# Patient Record
Sex: Female | Born: 1995 | Race: Black or African American | Hispanic: No | Marital: Single | Smoking: Former smoker
Health system: Southern US, Community
[De-identification: ages and names within clinical notes are randomized; demographics above are authoritative.]

## PROBLEM LIST (undated history)

## (undated) ENCOUNTER — Inpatient Hospital Stay (HOSPITAL_COMMUNITY): Payer: Self-pay

## (undated) DIAGNOSIS — N739 Female pelvic inflammatory disease, unspecified: Secondary | ICD-10-CM

## (undated) DIAGNOSIS — G629 Polyneuropathy, unspecified: Secondary | ICD-10-CM

## (undated) DIAGNOSIS — O139 Gestational [pregnancy-induced] hypertension without significant proteinuria, unspecified trimester: Secondary | ICD-10-CM

## (undated) DIAGNOSIS — D219 Benign neoplasm of connective and other soft tissue, unspecified: Secondary | ICD-10-CM

## (undated) HISTORY — PX: FINGER TENDON REPAIR: SHX1640

## (undated) HISTORY — PX: COSMETIC SURGERY: SHX468

---
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
## 2013-02-16 LAB — PROCEDURE REPORT - SCANNED: Pap: NEGATIVE

## 2013-12-09 ENCOUNTER — Encounter (HOSPITAL_COMMUNITY): Payer: Self-pay | Admitting: Emergency Medicine

## 2013-12-09 ENCOUNTER — Emergency Department (HOSPITAL_COMMUNITY)
Admission: EM | Admit: 2013-12-09 | Discharge: 2013-12-09 | Disposition: A | Discharge status: Home / Self Care | Payer: BC Managed Care – PPO | Attending: Emergency Medicine | Admitting: Emergency Medicine

## 2013-12-09 DIAGNOSIS — F17200 Nicotine dependence, unspecified, uncomplicated: Secondary | ICD-10-CM | POA: Insufficient documentation

## 2013-12-09 DIAGNOSIS — T7421XA Adult sexual abuse, confirmed, initial encounter: Secondary | ICD-10-CM | POA: Insufficient documentation

## 2013-12-09 DIAGNOSIS — S199XXA Unspecified injury of neck, initial encounter: Secondary | ICD-10-CM

## 2013-12-09 DIAGNOSIS — S0181XA Laceration without foreign body of other part of head, initial encounter: Secondary | ICD-10-CM

## 2013-12-09 DIAGNOSIS — S0993XA Unspecified injury of face, initial encounter: Secondary | ICD-10-CM | POA: Diagnosis present

## 2013-12-09 DIAGNOSIS — S0180XA Unspecified open wound of other part of head, initial encounter: Secondary | ICD-10-CM | POA: Insufficient documentation

## 2013-12-09 MED ORDER — HYDROCODONE-ACETAMINOPHEN 5-325 MG PO TABS: ORAL_TABLET | ORAL | Status: DC

## 2013-12-09 MED ORDER — NAPROXEN 500 MG PO TABS: 500.0000 mg | ORAL_TABLET | Freq: Two times a day (BID) | ORAL | Status: DC

## 2013-12-09 MED ORDER — HYDROCODONE-ACETAMINOPHEN 5-325 MG PO TABS
1.0000 | ORAL_TABLET | Freq: Once | ORAL | Status: AC
  Administered 2013-12-09: 1 via ORAL
  Filled 2013-12-09: qty 1

## 2013-12-09 MED ORDER — LIDOCAINE-EPINEPHRINE 2 %-1:100000 IJ SOLN: 20.0000 mL | Freq: Once | INTRAMUSCULAR | Status: DC
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
## 2013-12-09 NOTE — ED Notes (Signed)
Bed: WTR6 Expected date:  Expected time:  Means of arrival:  Comments: 

## 2013-12-09 NOTE — ED Notes (Signed)
Suture cart and lidocaine at bedside.  

## 2013-12-09 NOTE — ED Provider Notes (Signed)
Medical screening examination/treatment/procedure(s) were performed by non-physician practitioner and as supervising physician I was immediately available for consultation/collaboration.   EKG Interpretation None       Kalman Drape, MD 12/09/13 [phone]

## 2013-12-09 NOTE — Discharge Instructions (Signed)
Please read and follow all provided instructions.  Your diagnoses today include:  1. Forehead laceration, initial encounter     Tests performed today include:  Vital signs. See below for your results today.   Medications prescribed:   Vicodin (hydrocodone/acetaminophen) - narcotic pain medication  DO NOT drive or perform any activities that require you to be awake and alert because this medicine can make you drowsy. BE VERY CAREFUL not to take multiple medicines containing Tylenol (also called acetaminophen). Doing so can lead to an overdose which can damage your liver and cause liver failure and possibly death.   Naproxen - anti-inflammatory pain medication  Do not exceed 500mg  naproxen every 12 hours, take with food  You have been prescribed an anti-inflammatory medication or NSAID. Take with food. Take smallest effective dose for the shortest duration needed for your pain. Stop taking if you experience stomach pain or vomiting.   Take any prescribed medications only as directed.   Home care instructions:  Follow any educational materials and wound care instructions contained in this packet.   Keep affected area above the level of your heart when possible to minimize swelling. Wash area gently twice a day with warm soapy water. Do not apply alcohol or hydrogen peroxide. Cover the area if it draining or weeping.   Follow-up instructions: Suture Removal: Return to the Emergency Department or see your primary care care doctor in 3-4 days for a recheck of your wound and removal of your sutures or staples.    Return instructions:  Return to the Emergency Department if you have:  Fever  Worsening pain  Worsening swelling of the wound  Pus draining from the wound  Redness of the skin that moves away from the wound, especially if it streaks away from the affected area   Any other emergent concerns  Your vital signs today were: BP 117/77   Pulse 80   Temp(Src) 97.4 F (36.3  C) (Oral)   Resp 18   Ht 5\' 2"  (1.575 m)   Wt 186 lb (84.369 kg)   BMI 34.01 kg/m2   SpO2 99%   LMP 11/26/2013 If your blood pressure (BP) was elevated above 135/85 this visit, please have this repeated by your doctor within one month. --------------

## 2013-12-14 ENCOUNTER — Encounter (HOSPITAL_COMMUNITY): Payer: Self-pay | Admitting: Emergency Medicine

## 2013-12-14 ENCOUNTER — Emergency Department (HOSPITAL_COMMUNITY)
Admission: EM | Admit: 2013-12-14 | Discharge: 2013-12-14 | Disposition: A | Discharge status: Home / Self Care | Payer: BC Managed Care – PPO | Attending: Emergency Medicine | Admitting: Emergency Medicine

## 2013-12-14 DIAGNOSIS — Z4802 Encounter for removal of sutures: Secondary | ICD-10-CM | POA: Diagnosis present

## 2013-12-14 DIAGNOSIS — F17200 Nicotine dependence, unspecified, uncomplicated: Secondary | ICD-10-CM | POA: Insufficient documentation

## 2013-12-14 NOTE — ED Notes (Signed)
Pt to be assessed for suture removal from forehead

## 2013-12-14 NOTE — Discharge Instructions (Signed)
May apply neosporin for now until scab comes off.  Once this happens, may start applying mederma to help reduce scarring. Return to the ED for new concerns.

## 2013-12-14 NOTE — ED Provider Notes (Signed)
CSN: [id number]     Arrival date & time 12/14/13  1518 History  This chart was scribed for non-physician practitioner, Quincy Carnes, PA-C working with Babette Relic, MD by Frederich Balding, ED scribe. This patient was seen in room WTR8/WTR8 and the patient's care was started at 3:30 PM.    Chief Complaint  Patient presents with  . Suture / Staple Removal   The history is provided by the patient. No language interpreter was used.   HPI Comments: Kimberly Ponce is a 18 y.o. female who presents to the Emergency Department for suture removal. Pt had 5 sutures placed to her forehead 5 days ago. Denies any pain or drainage from the area. No fever, chills, sweats.  Pt's tetanus is up to date.   No past medical history on file. No past surgical history on file. No family history on file. History  Substance Use Topics  . Smoking status: Current Some Day Smoker  . Smokeless tobacco: Not on file  . Alcohol Use: Yes     Comment: occ   OB History   Grav Para Term Preterm Abortions TAB SAB Ect Mult Living                 Review of Systems  Skin: Positive for wound.  All other systems reviewed and are negative.  Allergies  Other  Home Medications   Prior to Admission medications   Medication Sig Start Date End Date Taking? Authorizing Provider  ibuprofen (ADVIL,MOTRIN) 200 MG tablet Take 400 mg by mouth every 6 (six) hours as needed for headache or cramping.   Yes Historical Provider, MD   LMP 11/26/2013  Physical Exam  Nursing note and vitals reviewed. Constitutional: She is oriented to person, place, and time. She appears well-developed and well-nourished.  HENT:  Head: Normocephalic and atraumatic.  Mouth/Throat: Oropharynx is clear and moist.  Eyes: Conjunctivae and EOM are normal. Pupils are equal, round, and reactive to light.  Neck: Normal range of motion.  Cardiovascular: Normal rate, regular rhythm and normal heart sounds.   Pulmonary/Chest: Effort normal and breath  sounds normal.  Abdominal: Soft. Bowel sounds are normal.  Musculoskeletal: Normal range of motion.  Neurological: She is alert and oriented to person, place, and time.  Skin: Skin is warm and dry.  5 Ethilon sutures to mid forehead. No drainage or sings of infection.  Psychiatric: She has a normal mood and affect.    ED Course  Procedures (including critical care time)  SUTURE REMOVAL Performed by: Quincy Carnes, PA-C Authorized by: Quincy Carnes, PA-C Consent: Verbal consent obtained. Consent given by: patient Required items: required blood products, implants, devices, and special equipment available  Time out: Immediately prior to procedure a "time out" was called to verify the correct patient, procedure, equipment, support staff and site/side marked as required. Location: forehead Wound Appearance: clean, well healing  Sutures Removed: 5 Patient tolerance: Patient tolerated the procedure well with no immediate complications.   COORDINATION OF CARE: 3:32 PM-Discussed treatment plan which includes suture removal with pt at bedside and pt agreed to plan.   Labs Review Labs Reviewed - No data to display  Imaging Review No results found.   EKG Interpretation None      MDM   Final diagnoses:  Visit for suture removal   Tetanus UTD.  Wound healing well without signs of infection. Sutures removed without difficulty.  Encouraged neosporin for now, mederma to help reduce scarring once fully healed. FU with PCP as  needed.  Discussed plan with patient, he/she acknowledged understanding and agreed with plan of care.  Return precautions given for new or worsening symptoms.  I personally performed the services described in this documentation, which was scribed in my presence. The recorded information has been reviewed and is accurate.  Larene Pickett, PA-C 12/14/13 1551

## 2013-12-27 NOTE — ED Provider Notes (Signed)
Medical screening examination/treatment/procedure(s) were performed by non-physician practitioner and as supervising physician I was immediately available for consultation/collaboration.   EKG Interpretation None       Babette Relic, MD 12/27/13 1311

## 2014-03-23 ENCOUNTER — Encounter (HOSPITAL_COMMUNITY): Payer: Self-pay | Admitting: Oncology

## 2014-03-23 ENCOUNTER — Emergency Department (HOSPITAL_COMMUNITY)
Admission: EM | Admit: 2014-03-23 | Discharge: 2014-03-24 | Disposition: A | Discharge status: Home / Self Care | Payer: BC Managed Care – PPO | Attending: Emergency Medicine | Admitting: Emergency Medicine

## 2014-03-23 DIAGNOSIS — R109 Unspecified abdominal pain: Secondary | ICD-10-CM | POA: Diagnosis present

## 2014-03-23 DIAGNOSIS — Z720 Tobacco use: Secondary | ICD-10-CM | POA: Diagnosis not present

## 2014-03-23 DIAGNOSIS — Z3202 Encounter for pregnancy test, result negative: Secondary | ICD-10-CM | POA: Insufficient documentation

## 2014-03-23 DIAGNOSIS — N739 Female pelvic inflammatory disease, unspecified: Secondary | ICD-10-CM | POA: Diagnosis not present

## 2014-03-23 DIAGNOSIS — N730 Acute parametritis and pelvic cellulitis: Secondary | ICD-10-CM

## 2014-03-23 LAB — CBC WITH DIFFERENTIAL/PLATELET
BASOS PCT: 0 % (ref 0–1)
Basophils Absolute: 0 10*3/uL (ref 0.0–0.1)
Eosinophils Absolute: 0 10*3/uL (ref 0.0–0.7)
Eosinophils Relative: 0 % (ref 0–5)
HCT: 36.9 % (ref 36.0–46.0)
HEMOGLOBIN: 12 g/dL (ref 12.0–15.0)
LYMPHS ABS: 2 10*3/uL (ref 0.7–4.0)
Lymphocytes Relative: 18 % (ref 12–46)
MCH: 30.2 pg (ref 26.0–34.0)
MCHC: 32.5 g/dL (ref 30.0–36.0)
MCV: 92.7 fL (ref 78.0–100.0)
MONOS PCT: 6 % (ref 3–12)
Monocytes Absolute: 0.7 10*3/uL (ref 0.1–1.0)
NEUTROS ABS: 8.1 10*3/uL — AB (ref 1.7–7.7)
NEUTROS PCT: 76 % (ref 43–77)
PLATELETS: 288 10*3/uL (ref 150–400)
RBC: 3.98 MIL/uL (ref 3.87–5.11)
RDW: 12.7 % (ref 11.5–15.5)
WBC: 10.8 10*3/uL — ABNORMAL HIGH (ref 4.0–10.5)

## 2014-03-23 LAB — URINALYSIS, ROUTINE W REFLEX MICROSCOPIC
Bilirubin Urine: NEGATIVE
GLUCOSE, UA: NEGATIVE mg/dL
HGB URINE DIPSTICK: NEGATIVE
Ketones, ur: NEGATIVE mg/dL
Nitrite: NEGATIVE
PH: 6.5 (ref 5.0–8.0)
Protein, ur: NEGATIVE mg/dL
SPECIFIC GRAVITY, URINE: 1.024 (ref 1.005–1.030)
Urobilinogen, UA: 1 mg/dL (ref 0.0–1.0)

## 2014-03-23 LAB — POC URINE PREG, ED: Preg Test, Ur: NEGATIVE

## 2014-03-23 LAB — URINE MICROSCOPIC-ADD ON

## 2014-03-23 NOTE — ED Notes (Signed)
Pt c/o of intermittent abdominal pain x 3 days.  Pt reports that pain became constant today w/ radiation to her back and her legs.  Pt rates the pain 7/10, crampy in nature.

## 2014-03-24 LAB — WET PREP, GENITAL
Clue Cells Wet Prep HPF POC: NONE SEEN
Trich, Wet Prep: NONE SEEN
Yeast Wet Prep HPF POC: NONE SEEN

## 2014-03-24 LAB — COMPREHENSIVE METABOLIC PANEL
ALBUMIN: 4.4 g/dL (ref 3.5–5.2)
ALK PHOS: 48 U/L (ref 39–117)
ALT: 12 U/L (ref 0–35)
ANION GAP: 6 (ref 5–15)
AST: 16 U/L (ref 0–37)
BILIRUBIN TOTAL: 0.2 mg/dL — AB (ref 0.3–1.2)
BUN: 11 mg/dL (ref 6–23)
CHLORIDE: 104 meq/L (ref 96–112)
CO2: 28 mmol/L (ref 19–32)
Calcium: 9.8 mg/dL (ref 8.4–10.5)
Creatinine, Ser: 0.87 mg/dL (ref 0.50–1.10)
GFR calc Af Amer: >90 mL/min (ref >90)
GFR calc non Af Amer: >90 mL/min (ref >90)
Glucose, Bld: 98 mg/dL (ref 70–99)
POTASSIUM: 3.5 mmol/L (ref 3.5–5.1)
SODIUM: 138 mmol/L (ref 135–145)
Total Protein: 8.3 g/dL (ref 6.0–8.3)

## 2014-03-24 LAB — LIPASE, BLOOD: Lipase: 17 U/L (ref 11–59)

## 2014-03-24 LAB — HIV ANTIBODY (ROUTINE TESTING W REFLEX): HIV 1&2 Ab, 4th Generation: NONREACTIVE

## 2014-03-24 LAB — RPR

## 2014-03-24 MED ORDER — HYDROCODONE-ACETAMINOPHEN 5-325 MG PO TABS
1.0000 | ORAL_TABLET | Freq: Once | ORAL | Status: AC
  Administered 2014-03-24: 1 via ORAL
  Filled 2014-03-24: qty 1

## 2014-03-24 MED ORDER — DOXYCYCLINE HYCLATE 100 MG PO CAPS: 100.0000 mg | ORAL_CAPSULE | Freq: Two times a day (BID) | ORAL | Status: DC

## 2014-03-24 MED ORDER — LIDOCAINE HCL 1 % IJ SOLN
INTRAMUSCULAR | Status: AC
  Administered 2014-03-24: 0.9 mL
  Filled 2014-03-24: qty 20

## 2014-03-24 MED ORDER — CEFTRIAXONE SODIUM 250 MG IJ SOLR
250.0000 mg | Freq: Once | INTRAMUSCULAR | Status: AC
  Administered 2014-03-24: 250 mg via INTRAMUSCULAR
  Filled 2014-03-24: qty 250

## 2014-03-24 MED ORDER — HYDROCODONE-ACETAMINOPHEN 5-325 MG PO TABS: 1.0000 | ORAL_TABLET | Freq: Four times a day (QID) | ORAL | Status: DC | PRN

## 2014-03-24 NOTE — ED Provider Notes (Signed)
CSN: [id number]     Arrival date & time 03/23/14  2248 History   First MD Initiated Contact with Patient 03/23/14 2328     Chief Complaint  Patient presents with  . Abdominal Pain   HPI  Patient is a 18 year old female who presents emergency room for evaluation of suprapubic abdominal pain. Patient states that originally her pain was intermittent and crampy in nature. Pain became constant today and radiates to her lower back and bilateral legs. Patient states she's been having urinary frequency and urgency. She has had no dysuria. She has had no vaginal discharge. She does think that she noticed some blood in her urine today. Patient recently had her last period on December 16 and said that it was slightly heavier than normal. Patient is not currently on any contraceptives. Patient is sexually active and is not using condoms regularly. Patient has had no new partners. Patient does not report any past history of STDs.  History reviewed. No pertinent past medical history. History reviewed. No pertinent past surgical history. Family History  Problem Relation Age of Onset  . Cancer Father   . Hypertension Father   . Cancer Other   . Diabetes Other    History  Substance Use Topics  . Smoking status: Current Some Day Smoker  . Smokeless tobacco: Not on file  . Alcohol Use: Yes     Comment: occ   OB History    No data available     Review of Systems  Constitutional: Negative for fever, chills and fatigue.  Gastrointestinal: Positive for nausea and abdominal pain. Negative for vomiting, diarrhea, constipation and blood in stool.  Genitourinary: Positive for urgency, frequency and hematuria. Negative for dysuria, flank pain, decreased urine volume, vaginal bleeding, vaginal discharge, difficulty urinating, vaginal pain and dyspareunia.  Musculoskeletal: Positive for back pain.  All other systems reviewed and are negative.     Allergies  Other  Home Medications   Prior to  Admission medications   Medication Sig Start Date End Date Taking? Authorizing Provider  aspirin-acetaminophen-caffeine (PAMPRIN MAX) [phone] MG per tablet Take 1 tablet by mouth every 6 (six) hours as needed for headache.   Yes Historical Provider, MD  ibuprofen (ADVIL,MOTRIN) 200 MG tablet Take 400 mg by mouth every 6 (six) hours as needed for headache or cramping.   Yes Historical Provider, MD  doxycycline (VIBRAMYCIN) 100 MG capsule Take 1 capsule (100 mg total) by mouth 2 (two) times daily. One po bid x 7 days 03/24/14   Jonathen Rathman A Forcucci, PA-C  HYDROcodone-acetaminophen (NORCO/VICODIN) 5-325 MG per tablet Take 1 tablet by mouth every 6 (six) hours as needed for moderate pain or severe pain. 03/24/14   Ronne Savoia A Forcucci, PA-C   BP 125/68 mmHg  Pulse 70  Temp(Src) 98.5 F (36.9 C) (Oral)  Resp 20  SpO2 100%  LMP 03/15/2014 Physical Exam  Constitutional: She is oriented to person, place, and time. She appears well-developed and well-nourished. No distress.  HENT:  Head: Normocephalic and atraumatic.  Mouth/Throat: Oropharynx is clear and moist. No oropharyngeal exudate.  Eyes: Conjunctivae and EOM are normal. Pupils are equal, round, and reactive to light. No scleral icterus.  Neck: Normal range of motion. Neck supple. No JVD present. No thyromegaly present.  Cardiovascular: Normal rate, regular rhythm, normal heart sounds and intact distal pulses.  Exam reveals no gallop and no friction rub.   No murmur heard. Pulmonary/Chest: Effort normal and breath sounds normal. No respiratory distress. She has no wheezes. She  has no rales. She exhibits no tenderness.  Abdominal: Soft. Normal appearance and bowel sounds are normal. She exhibits no distension and no mass. There is tenderness in the right lower quadrant, suprapubic area and left lower quadrant. There is no rigidity, no rebound, no guarding, no tenderness at McBurney's point and negative Murphy's sign.  Genitourinary: No labial  fusion. There is no rash, tenderness, lesion or injury on the right labia. There is no rash, tenderness, lesion or injury on the left labia. Uterus is tender. Uterus is not deviated, not enlarged and not fixed. Cervix exhibits motion tenderness (Minimal) and friability. Cervix exhibits no discharge. Right adnexum displays no mass, no tenderness and no fullness. Left adnexum displays no mass, no tenderness and no fullness. No erythema, tenderness or bleeding in the vagina. No foreign body around the vagina. No signs of injury around the vagina. Vaginal discharge found.  Thin yellow discharge in the vaginal vault  Musculoskeletal: Normal range of motion.  Lymphadenopathy:    She has no cervical adenopathy.  Neurological: She is alert and oriented to person, place, and time.  Skin: Skin is warm and dry. She is not diaphoretic.  Psychiatric: She has a normal mood and affect. Her behavior is normal. Judgment and thought content normal.  Nursing note and vitals reviewed.   ED Course  Procedures (including critical care time) Labs Review Labs Reviewed  WET PREP, GENITAL - Abnormal; Notable for the following:    WBC, Wet Prep HPF POC TOO NUMEROUS TO COUNT (*)    All other components within normal limits  CBC WITH DIFFERENTIAL - Abnormal; Notable for the following:    WBC 10.8 (*)    Neutro Abs 8.1 (*)    All other components within normal limits  COMPREHENSIVE METABOLIC PANEL - Abnormal; Notable for the following:    Total Bilirubin 0.2 (*)    All other components within normal limits  URINALYSIS, ROUTINE W REFLEX MICROSCOPIC - Abnormal; Notable for the following:    Leukocytes, UA SMALL (*)    All other components within normal limits  URINE MICROSCOPIC-ADD ON - Abnormal; Notable for the following:    Squamous Epithelial / LPF FEW (*)    All other components within normal limits  GC/CHLAMYDIA PROBE AMP  LIPASE, BLOOD  RPR  HIV ANTIBODY (ROUTINE TESTING)  POC URINE PREG, ED    Imaging  Review No results found.   EKG Interpretation None      MDM   Final diagnoses:  PID (acute pelvic inflammatory disease)   Patient is a 18 year old female who presents emergency room for evaluation of suprapubic pain. Physical exam reveals a thin yellow discharge in the vaginal vault and minimal cervical motion tenderness. Wet prep reveals too numerous to count white blood cells. UA is contaminated without signs of frank infection. GC is pending. HIV and RPR pending. Urine pregnancy is negative. CBC reveals mild leukocytosis. CMP is unremarkable. Suspect that this is likely early PID versus cervicitis. There is no adnexal fullness or tenderness. Do not feel that pelvic ultrasound is indicated at this time. We will treat prophylactically with ceftriaxone 250 mg IM and will discharge home with doxycycline 100 mg twice a day 14 days. Patient follow-up with her results in 3-5 days about the department. Patient to return for intractable nausea and vomiting, worsening abdominal pain, or any other concerning symptoms. She states understanding and agreement at this time. I've told her to abstain from sexual activity until all of her symptoms have resolved and  she is finish her course of treatment. I also encouraged her to speak with her partner about the possibility of infection. We'll discharge home with short course of Norco. Patient stable for discharge. I have discussed the patient with Dr. Claudine Mouton who agrees with the above workup and plan.   Cherylann Parr, PA-C 03/24/14 2957  Everlene Balls, MD 03/24/14 1346

## 2014-03-24 NOTE — Discharge Instructions (Signed)
Pelvic Inflammatory Disease Pelvic inflammatory disease (PID) refers to an infection in some or all of the female organs. The infection can be in the uterus, ovaries, fallopian tubes, or the surrounding tissues in the pelvis. PID can cause abdominal or pelvic pain that comes on suddenly (acute pelvic pain). PID is a serious infection because it can lead to lasting (chronic) pelvic pain or the inability to have children (infertile).  CAUSES  The infection is often caused by the normal bacteria found in the vaginal tissues. PID may also be caused by an infection that is spread during sexual contact. PID can also occur following:   The birth of a baby.   A miscarriage.   An abortion.   Major pelvic surgery.   The use of an intrauterine device (IUD).   A sexual assault.  RISK FACTORS Certain factors can put a person at higher risk for PID, such as:  Being younger than 25 years.  Being sexually active at [place] age.  Usingnonbarrier contraception.  Havingmultiple sexual partners.  Having sex with someone who has symptoms of a genital infection.  Using oral contraception. Other times, certain behaviors can increase the possibility of getting PID, such as:  Having sex during your period.  Using a vaginal douche.  Having an intrauterine device (IUD) in place. SYMPTOMS   Abdominal or pelvic pain.   Fever.   Chills.   Abnormal vaginal discharge.  Abnormal uterine bleeding.   Unusual pain shortly after finishing your period. DIAGNOSIS  Your caregiver will choose some of the following methods to make a diagnosis, such as:   Performinga physical exam and history. A pelvic exam typically reveals a very tender uterus and surrounding pelvis.   Ordering laboratory tests including a pregnancy test, blood tests, and urine test.  Orderingcultures of the vagina and cervix to check for a sexually transmitted infection (STI).  Performing an ultrasound.    Performing a laparoscopic procedure to look inside the pelvis.  TREATMENT   Antibiotic medicines may be prescribed and taken by mouth.   Sexual partners may be treated when the infection is caused by a sexually transmitted disease (STD).   Hospitalization may be needed to give antibiotics intravenously.  Surgery may be needed, but this is rare. It may take weeks until you are completely well. If you are diagnosed with PID, you should also be checked for human immunodeficiency virus (HIV). HOME CARE INSTRUCTIONS   If given, take your antibiotics as directed. Finish the medicine even if you start to feel better.   Only take over-the-counter or prescription medicines for pain, discomfort, or fever as directed by your caregiver.   Do not have sexual intercourse until treatment is completed or as directed by your caregiver. If PID is confirmed, your recent sexual partner(s) will need treatment.   Keep your follow-up appointments. SEEK MEDICAL CARE IF:   You have increased or abnormal vaginal discharge.   You need prescription medicine for your pain.   You vomit.   You cannot take your medicines.   Your partner has an STD.  SEEK IMMEDIATE MEDICAL CARE IF:   You have a fever.   You have increased abdominal or pelvic pain.   You have chills.   You have pain when you urinate.   You are not better after 72 hours following treatment.  MAKE SURE YOU:   Understand these instructions.  Will watch your condition.  Will get help right away if you are not doing well or get worse.  Document Released: 03/16/2005 Document Revised: 07/11/2012 Document Reviewed: 03/12/2011 [facility] Patient Information 2015 [place], [place]. This information is not intended to replace advice given to you by your health care provider. Make sure you discuss any questions you have with your health care prov Sexually Transmitted Disease A sexually transmitted disease (STD) is a disease  or infection that may be passed (transmitted) from person to person, usually during sexual activity. This may happen by way of saliva, semen, blood, vaginal mucus, or urine. Common STDs include:   Gonorrhea.   Chlamydia.   Syphilis.   HIV and AIDS.   Genital herpes.   Hepatitis B and C.   Trichomonas.   Human papillomavirus (HPV).   Pubic lice.   Scabies.  Mites.  Bacterial vaginosis. WHAT ARE CAUSES OF STDs? An STD may be caused by bacteria, a virus, or parasites. STDs are often transmitted during sexual activity if one person is infected. However, they may also be transmitted through nonsexual means. STDs may be transmitted after:   Sexual intercourse with an infected person.   Sharing sex toys with an infected person.   Sharing needles with an infected person or using unclean piercing or tattoo needles.  Having intimate contact with the genitals, mouth, or rectal areas of an infected person.   Exposure to infected fluids during birth. WHAT ARE THE SIGNS AND SYMPTOMS OF STDs? Different STDs have different symptoms. Some people may not have any symptoms. If symptoms are present, they may include:   Painful or bloody urination.   Pain in the pelvis, abdomen, vagina, anus, throat, or eyes.   A skin rash, itching, or irritation.  Growths, ulcerations, blisters, or sores in the genital and anal areas.  Abnormal vaginal discharge with or without bad odor.   Penile discharge in men.   Fever.   Pain or bleeding during sexual intercourse.   Swollen glands in the groin area.   Yellow skin and eyes (jaundice). This is seen with hepatitis.   Swollen testicles.  Infertility.  Sores and blisters in the mouth. HOW ARE STDs DIAGNOSED? To make a diagnosis, your health care provider may:   Take a medical history.   Perform a physical exam.   Take a sample of any discharge to examine.  Swab the throat, cervix, opening to the penis,  rectum, or vagina for testing.  Test a sample of your first morning urine.   Perform blood tests.   Perform a Pap test, if this applies.   Perform a colposcopy.   Perform a laparoscopy.  HOW ARE STDs TREATED? Treatment depends on the STD. Some STDs may be treated but not cured.   Chlamydia, gonorrhea, trichomonas, and syphilis can be cured with antibiotic medicine.   Genital herpes, hepatitis, and HIV can be treated, but not cured, with prescribed medicines. The medicines lessen symptoms.   Genital warts from HPV can be treated with medicine or by freezing, burning (electrocautery), or surgery. Warts may come back.   HPV cannot be cured with medicine or surgery. However, abnormal areas may be removed from the cervix, vagina, or vulva.   If your diagnosis is confirmed, your recent sexual partners need treatment. This is true even if they are symptom-free or have a negative culture or evaluation. They should not have sex until their health care providers say it is okay. HOW CAN I REDUCE MY RISK OF GETTING AN STD? Take these steps to reduce your risk of getting an STD:  Use latex condoms, dental dams, and  water-soluble lubricants during sexual activity. Do not use petroleum jelly or oils.  Avoid having multiple sex partners.  Do not have sex with someone who has other sex partners.  Do not have sex with anyone you do not know or who is at high risk for an STD.  Avoid risky sex practices that can break your skin.  Do not have sex if you have open sores on your mouth or skin.  Avoid drinking too much alcohol or taking illegal drugs. Alcohol and drugs can affect your judgment and put you in a vulnerable position.  Avoid engaging in oral and anal sex acts.  Get vaccinated for HPV and hepatitis. If you have not received these vaccines in the past, talk to your health care provider about whether one or both might be right for you.   If you are at risk of being infected  with HIV, it is recommended that you take a prescription medicine daily to prevent HIV infection. This is called pre-exposure prophylaxis (PrEP). You are considered at risk if:  You are a man who has sex with other men (MSM).  You are a heterosexual man or woman and are sexually active with more than one partner.  You take drugs by injection.  You are sexually active with a partner who has HIV.  Talk with your health care provider about whether you are at high risk of being infected with HIV. If you choose to begin PrEP, you should first be tested for HIV. You should then be tested every 3 months for as long as you are taking PrEP.  WHAT SHOULD I DO IF I THINK I HAVE AN STD?  See your health care provider.   Tell your sexual partner(s). They should be tested and treated for any STDs.  Do not have sex until your health care provider says it is okay. WHEN SHOULD I GET IMMEDIATE MEDICAL CARE? Contact your health care provider right away if:   You have severe abdominal pain.  You are a man and notice swelling or pain in your testicles.  You are a woman and notice swelling or pain in your vagina. Document Released: 06/06/2002 Document Revised: 03/21/2013 Document Reviewed: 10/04/2012 [facility] Patient Information 2015 [place], [place]. This information is not intended to replace advice given to you by your health care provider. Make sure you discuss any questions you have with your health care provider.

## 2014-03-26 LAB — GC/CHLAMYDIA PROBE AMP
CT PROBE, AMP APTIMA: POSITIVE — AB
GC Probe RNA: POSITIVE — AB

## 2014-03-29 ENCOUNTER — Telehealth (HOSPITAL_BASED_OUTPATIENT_CLINIC_OR_DEPARTMENT_OTHER): Payer: Self-pay | Admitting: *Deleted

## 2014-03-30 ENCOUNTER — Telehealth (HOSPITAL_COMMUNITY): Payer: Self-pay | Admitting: *Deleted

## 2014-05-20 ENCOUNTER — Encounter (HOSPITAL_COMMUNITY): Payer: Self-pay

## 2014-05-20 ENCOUNTER — Emergency Department (HOSPITAL_COMMUNITY)
Admission: EM | Admit: 2014-05-20 | Discharge: 2014-05-21 | Disposition: A | Discharge status: Home / Self Care | Payer: Managed Care, Other (non HMO) | Attending: Emergency Medicine | Admitting: Emergency Medicine

## 2014-05-20 DIAGNOSIS — Z739 Problem related to life management difficulty, unspecified: Secondary | ICD-10-CM | POA: Diagnosis not present

## 2014-05-20 DIAGNOSIS — R079 Chest pain, unspecified: Secondary | ICD-10-CM | POA: Insufficient documentation

## 2014-05-20 DIAGNOSIS — Z3202 Encounter for pregnancy test, result negative: Secondary | ICD-10-CM | POA: Insufficient documentation

## 2014-05-20 DIAGNOSIS — Z79899 Other long term (current) drug therapy: Secondary | ICD-10-CM | POA: Insufficient documentation

## 2014-05-20 DIAGNOSIS — R109 Unspecified abdominal pain: Secondary | ICD-10-CM | POA: Diagnosis present

## 2014-05-20 DIAGNOSIS — N730 Acute parametritis and pelvic cellulitis: Secondary | ICD-10-CM

## 2014-05-20 DIAGNOSIS — Z720 Tobacco use: Secondary | ICD-10-CM | POA: Diagnosis not present

## 2014-05-20 DIAGNOSIS — K2970 Gastritis, unspecified, without bleeding: Secondary | ICD-10-CM

## 2014-05-20 LAB — URINALYSIS, ROUTINE W REFLEX MICROSCOPIC
BILIRUBIN URINE: NEGATIVE
GLUCOSE, UA: NEGATIVE mg/dL
Hgb urine dipstick: NEGATIVE
KETONES UR: NEGATIVE mg/dL
LEUKOCYTES UA: NEGATIVE
Nitrite: NEGATIVE
Protein, ur: NEGATIVE mg/dL
Specific Gravity, Urine: 1.03 (ref 1.005–1.030)
Urobilinogen, UA: 0.2 mg/dL (ref 0.0–1.0)
pH: 6 (ref 5.0–8.0)

## 2014-05-20 LAB — CBC
HCT: 38.2 % (ref 36.0–46.0)
Hemoglobin: 12.3 g/dL (ref 12.0–15.0)
MCH: 29.9 pg (ref 26.0–34.0)
MCHC: 32.2 g/dL (ref 30.0–36.0)
MCV: 92.9 fL (ref 78.0–100.0)
Platelets: 323 10*3/uL (ref 150–400)
RBC: 4.11 MIL/uL (ref 3.87–5.11)
RDW: 13 % (ref 11.5–15.5)
WBC: 5.8 10*3/uL (ref 4.0–10.5)

## 2014-05-20 LAB — POC URINE PREG, ED: PREG TEST UR: NEGATIVE

## 2014-05-20 LAB — I-STAT TROPONIN, ED: TROPONIN I, POC: 0 ng/mL (ref 0.00–0.08)

## 2014-05-20 LAB — BASIC METABOLIC PANEL
ANION GAP: 5 (ref 5–15)
BUN: 13 mg/dL (ref 6–23)
CHLORIDE: 106 mmol/L (ref 96–112)
CO2: 27 mmol/L (ref 19–32)
Calcium: 9.4 mg/dL (ref 8.4–10.5)
Creatinine, Ser: 0.94 mg/dL (ref 0.50–1.10)
GFR calc Af Amer: >90 mL/min (ref >90)
GFR calc non Af Amer: 87 mL/min — ABNORMAL LOW (ref >90)
Glucose, Bld: 86 mg/dL (ref 70–99)
Potassium: 4.1 mmol/L (ref 3.5–5.1)
Sodium: 138 mmol/L (ref 135–145)

## 2014-05-20 NOTE — ED Notes (Addendum)
Pt presents with c/o lower abdominal pain that started approx one week ago. Pt has a hx of PID. Pt also c/o mid chest pain/pressure for over approx 2 weeks.

## 2014-05-21 LAB — WET PREP, GENITAL
Clue Cells Wet Prep HPF POC: NONE SEEN
Yeast Wet Prep HPF POC: NONE SEEN

## 2014-05-21 LAB — GC/CHLAMYDIA PROBE AMP (~~LOC~~) NOT AT ARMC
Chlamydia: NEGATIVE
Neisseria Gonorrhea: NEGATIVE

## 2014-05-21 MED ORDER — METRONIDAZOLE 500 MG PO TABS
2000.0000 mg | ORAL_TABLET | Freq: Once | ORAL | Status: AC
  Administered 2014-05-21: 2000 mg via ORAL
  Filled 2014-05-21: qty 4

## 2014-05-21 MED ORDER — AZITHROMYCIN 250 MG PO TABS
1000.0000 mg | ORAL_TABLET | Freq: Once | ORAL | Status: AC
  Administered 2014-05-21: 1000 mg via ORAL
  Filled 2014-05-21: qty 4

## 2014-05-21 MED ORDER — TRAMADOL HCL 50 MG PO TABS: 50.0000 mg | ORAL_TABLET | Freq: Four times a day (QID) | ORAL | Status: DC | PRN

## 2014-05-21 MED ORDER — PROMETHAZINE HCL 25 MG PO TABS: 25.0000 mg | ORAL_TABLET | Freq: Four times a day (QID) | ORAL | Status: DC | PRN

## 2014-05-21 MED ORDER — CEFTRIAXONE SODIUM 250 MG IJ SOLR
250.0000 mg | Freq: Once | INTRAMUSCULAR | Status: AC
  Administered 2014-05-21: 250 mg via INTRAMUSCULAR
  Filled 2014-05-21: qty 250

## 2014-05-21 MED ORDER — FAMOTIDINE 40 MG PO TABS: 40.0000 mg | ORAL_TABLET | Freq: Every day | ORAL | Status: DC

## 2014-05-21 MED ORDER — DOXYCYCLINE HYCLATE 100 MG PO CAPS: 100.0000 mg | ORAL_CAPSULE | Freq: Two times a day (BID) | ORAL | Status: DC

## 2014-05-21 MED ORDER — STERILE WATER FOR INJECTION IJ SOLN
INTRAMUSCULAR | Status: AC
  Administered 2014-05-21: 01:00:00
  Filled 2014-05-21: qty 10

## 2014-05-21 NOTE — Discharge Instructions (Signed)
Our results in the ER show that you have a STD. We treated you for gonorrhea and chlamydia infection while you were in the ER. Your Trichomonas infection was positive as well - and you got antibiotics for that as well. Rest of the results show: There is no evidence of Urinary tract infection and the pregnancy test is negative as well.  PLEASE PRACTICE SAFE SEX. PLEASE INFORM YOUR PARTNER TO GET CHECKED AND TREATED FOR STD AS WELL.  See the outpatient doctor as requested.   Pelvic Inflammatory Disease Pelvic inflammatory disease (PID) refers to an infection in some or all of the female organs. The infection can be in the uterus, ovaries, fallopian tubes, or the surrounding tissues in the pelvis. PID can cause abdominal or pelvic pain that comes on suddenly (acute pelvic pain). PID is a serious infection because it can lead to lasting (chronic) pelvic pain or the inability to have children (infertile).  CAUSES  The infection is often caused by the normal bacteria found in the vaginal tissues. PID may also be caused by an infection that is spread during sexual contact. PID can also occur following:   The birth of a baby.   A miscarriage.   An abortion.   Major pelvic surgery.   The use of an intrauterine device (IUD).   A sexual assault.  RISK FACTORS Certain factors can put a person at higher risk for PID, such as:  Being younger than 25 years.  Being sexually active at [place] age.  Usingnonbarrier contraception.  Havingmultiple sexual partners.  Having sex with someone who has symptoms of a genital infection.  Using oral contraception. Other times, certain behaviors can increase the possibility of getting PID, such as:  Having sex during your period.  Using a vaginal douche.  Having an intrauterine device (IUD) in place. SYMPTOMS   Abdominal or pelvic pain.   Fever.   Chills.   Abnormal vaginal discharge.  Abnormal uterine bleeding.   Unusual  pain shortly after finishing your period. DIAGNOSIS  Your caregiver will choose some of the following methods to make a diagnosis, such as:   Performinga physical exam and history. A pelvic exam typically reveals a very tender uterus and surrounding pelvis.   Ordering laboratory tests including a pregnancy test, blood tests, and urine test.  Orderingcultures of the vagina and cervix to check for a sexually transmitted infection (STI).  Performing an ultrasound.   Performing a laparoscopic procedure to look inside the pelvis.  TREATMENT   Antibiotic medicines may be prescribed and taken by mouth.   Sexual partners may be treated when the infection is caused by a sexually transmitted disease (STD).   Hospitalization may be needed to give antibiotics intravenously.  Surgery may be needed, but this is rare. It may take weeks until you are completely well. If you are diagnosed with PID, you should also be checked for human immunodeficiency virus (HIV). HOME CARE INSTRUCTIONS   If given, take your antibiotics as directed. Finish the medicine even if you start to feel better.   Only take over-the-counter or prescription medicines for pain, discomfort, or fever as directed by your caregiver.   Do not have sexual intercourse until treatment is completed or as directed by your caregiver. If PID is confirmed, your recent sexual partner(s) will need treatment.   Keep your follow-up appointments. SEEK MEDICAL CARE IF:   You have increased or abnormal vaginal discharge.   You need prescription medicine for your pain.   You  vomit.   You cannot take your medicines.   Your partner has an STD.  SEEK IMMEDIATE MEDICAL CARE IF:   You have a fever.   You have increased abdominal or pelvic pain.   You have chills.   You have pain when you urinate.   You are not better after 72 hours following treatment.  MAKE SURE YOU:   Understand these instructions.  Will  watch your condition.  Will get help right away if you are not doing well or get worse. Document Released: 03/16/2005 Document Revised: 07/11/2012 Document Reviewed: 03/12/2011 [facility] Patient Information 2015 [place], [place]. This information is not intended to replace advice given to you by your health care provider. Make sure you discuss any questions you have with your health care provider. Gastritis, Adult Gastritis is soreness and swelling (inflammation) of the lining of the stomach. Gastritis can develop as a sudden onset (acute) or long-term (chronic) condition. If gastritis is not treated, it can lead to stomach bleeding and ulcers. CAUSES  Gastritis occurs when the stomach lining is weak or damaged. Digestive juices from the stomach then inflame the weakened stomach lining. The stomach lining may be weak or damaged due to viral or bacterial infections. One common bacterial infection is the Helicobacter pylori infection. Gastritis can also result from excessive alcohol consumption, taking certain medicines, or having too much acid in the stomach.  SYMPTOMS  In some cases, there are no symptoms. When symptoms are present, they may include: Pain or a burning sensation in the upper abdomen. Nausea. Vomiting. An uncomfortable feeling of fullness after eating. DIAGNOSIS  Your caregiver may suspect you have gastritis based on your symptoms and a physical exam. To determine the cause of your gastritis, your caregiver may perform the following: Blood or stool tests to check for the H pylori bacterium. Gastroscopy. A thin, flexible tube (endoscope) is passed down the esophagus and into the stomach. The endoscope has a light and camera on the end. Your caregiver uses the endoscope to view the inside of the stomach. Taking a tissue sample (biopsy) from the stomach to examine under a microscope. TREATMENT  Depending on the cause of your gastritis, medicines may be prescribed. If you have a  bacterial infection, such as an H pylori infection, antibiotics may be given. If your gastritis is caused by too much acid in the stomach, H2 blockers or antacids may be given. Your caregiver may recommend that you stop taking aspirin, ibuprofen, or other nonsteroidal anti-inflammatory drugs (NSAIDs). HOME CARE INSTRUCTIONS Only take over-the-counter or prescription medicines as directed by your caregiver. If you were given antibiotic medicines, take them as directed. Finish them even if you start to feel better. Drink enough fluids to keep your urine clear or pale yellow. Avoid foods and drinks that make your symptoms worse, such as: Caffeine or alcoholic drinks. Chocolate. Peppermint or mint flavorings. Garlic and onions. Spicy foods. Citrus fruits, such as oranges, lemons, or limes. Tomato-based foods such as sauce, chili, salsa, and pizza. Fried and fatty foods. Eat small, frequent meals instead of large meals. SEEK IMMEDIATE MEDICAL CARE IF:  You have black or dark red stools. You vomit blood or material that looks like coffee grounds. You are unable to keep fluids down. Your abdominal pain gets worse. You have a fever. You do not feel better after 1 week. You have any other questions or concerns. MAKE SURE YOU: Understand these instructions. Will watch your condition. Will get help right away if you are not  doing well or get worse. Document Released: 03/10/2001 Document Revised: 09/15/2011 Document Reviewed: 04/29/2011 [facility] Patient Information 2015 [place], [place]. This information is not intended to replace advice given to you by your health care provider. Make sure you discuss any questions you have with your health care provider.

## 2014-05-21 NOTE — ED Provider Notes (Signed)
Pelvic exam performed for Dr. Kathrynn Humble chaperoned by Starlyn Skeans, PA-C [facility]. Scant, white vaginal discharge. No vaginal bleeding. +CMT. No adnexal tenderness.  Carman Ching, PA-C 05/21/14 [id number]

## 2014-05-21 NOTE — ED Provider Notes (Signed)
CSN: [id number]     Arrival date & time 05/20/14  2041 History   First MD Initiated Contact with Patient 05/20/14 2309     Chief Complaint  Patient presents with  . Abdominal Pain  . Chest Pain     (Consider location/radiation/quality/duration/timing/severity/associated sxs/prior Treatment) HPI Comments: Pt comes in with cc of abd pain. Abd pain started 1 week ago, and is in the lower quadrants and moving to her back. Pain is intermittent, but severe. Pt denies any vaginal discharge. There is also epigastric chest pain. No association with food. Pain is also intermittent, with no n/v/f/c. BM is normal. No uti like sx.  Pt denies nausea, emesis, fevers, chills, chest pains, shortness of breath, headaches, abdominal pain, uti like symptoms.   Patient is a 19 y.o. female presenting with abdominal pain and chest pain. The history is provided by the patient.  Abdominal Pain Associated symptoms: no chest pain, no dysuria, no nausea, no shortness of breath and no vomiting   Chest Pain Associated symptoms: abdominal pain   Associated symptoms: no headache, no nausea, no shortness of breath and not vomiting     History reviewed. No pertinent past medical history. History reviewed. No pertinent past surgical history. Family History  Problem Relation Age of Onset  . Cancer Father   . Hypertension Father   . Cancer Other   . Diabetes Other    History  Substance Use Topics  . Smoking status: Current Some Day Smoker  . Smokeless tobacco: Not on file  . Alcohol Use: Yes     Comment: occ   OB History    No data available     Review of Systems  Constitutional: Positive for activity change.  Respiratory: Negative for shortness of breath.   Cardiovascular: Negative for chest pain.  Gastrointestinal: Positive for abdominal pain. Negative for nausea and vomiting.  Genitourinary: Negative for dysuria.  Musculoskeletal: Negative for neck pain.  Neurological: Negative for headaches.  All  other systems reviewed and are negative.     Allergies  Other  Home Medications   Prior to Admission medications   Medication Sig Start Date End Date Taking? Authorizing Provider  HYDROcodone-acetaminophen (NORCO/VICODIN) 5-325 MG per tablet Take 1 tablet by mouth every 6 (six) hours as needed for moderate pain or severe pain. 03/24/14  Yes Courtney A Forcucci, PA-C  ibuprofen (ADVIL,MOTRIN) 200 MG tablet Take 400 mg by mouth every 6 (six) hours as needed for headache, moderate pain or cramping (menstrual pain).    Yes Historical Provider, MD  doxycycline (VIBRAMYCIN) 100 MG capsule Take 1 capsule (100 mg total) by mouth 2 (two) times daily. 05/21/14   Jlee Harkless Kathrynn Humble, MD   BP 107/56 mmHg  Pulse 74  Temp(Src) 98.5 F (36.9 C) (Oral)  Resp 18  SpO2 97%  LMP 05/17/2014 Physical Exam  Constitutional: She is oriented to person, place, and time. She appears well-developed and well-nourished.  HENT:  Head: Normocephalic and atraumatic.  Eyes: EOM are normal. Pupils are equal, round, and reactive to light.  Neck: Neck supple.  Cardiovascular: Normal rate, regular rhythm and normal heart sounds.   No murmur heard. Pulmonary/Chest: Effort normal. No respiratory distress.  Abdominal: Soft. She exhibits no distension. There is tenderness. There is no rebound and no guarding.  Lower quadrant tenderness. Mild epigastric tenderness as well.  Genitourinary:  Performed by PA-C  Neurological: She is alert and oriented to person, place, and time.  Skin: Skin is warm and dry.  Nursing note and  vitals reviewed.   ED Course  Procedures (including critical care time) Labs Review Labs Reviewed  WET PREP, GENITAL - Abnormal; Notable for the following:    Trich, Wet Prep TOO NUMEROUS TO COUNT (*)    WBC, Wet Prep HPF POC FEW (*)    All other components within normal limits  BASIC METABOLIC PANEL - Abnormal; Notable for the following:    GFR calc non Af Amer 87 (*)    All other components  within normal limits  URINALYSIS, ROUTINE W REFLEX MICROSCOPIC - Abnormal; Notable for the following:    APPearance HAZY (*)    All other components within normal limits  CBC  I-STAT TROPOININ, ED  POC URINE PREG, ED  GC/CHLAMYDIA PROBE AMP ([place])    Imaging Review No results found.   EKG Interpretation   Date/Time:  Sunday May 20 2014 21:03:42 EST Ventricular Rate:  61 PR Interval:  185 QRS Duration: 76 QT Interval:  390 QTC Calculation: 393 R Axis:   82 Text Interpretation:  Sinus rhythm EKG WITHIN NORMAL LIMITS No acute  changes Confirmed by Kathrynn Humble, MD, [facility] [phone]) on 05/20/2014 11:36:15 PM      MDM   Final diagnoses:  PID (acute pelvic inflammatory disease)  Gastritis    Pt comes in with cc of abd pain.  DDx includes: Pancreatitis Hepatobiliary pathology including cholecystitis Gastritis/PUD Diverticulitis Pyelonephritis UTI/Cystitis Ovarian cyst TOA Ectopic pregnancy PID STD  PT comes in with epigastric abd pain and lower quadrant pain. Recent PID. Today, she has cervical motion tenderness on exam. States that she finished the antibiotic course (which seems was written for 7 days, and not longer). Also, trichomonas is +.  Will give her appropriate meds here, and 2 grams of flagyl for trich will be given here, as i dont want patient to not complete her AB rx.  The epigastric pain is mild, intermittent, no ruq pan. Will tx  As gastritis.               Varney Biles, MD 05/21/14 0100

## 2014-07-18 ENCOUNTER — Encounter: Payer: Managed Care, Other (non HMO) | Admitting: Obstetrics & Gynecology

## 2014-09-26 ENCOUNTER — Encounter (HOSPITAL_COMMUNITY): Payer: Self-pay

## 2014-09-26 ENCOUNTER — Emergency Department (HOSPITAL_COMMUNITY)
Admission: EM | Admit: 2014-09-26 | Discharge: 2014-09-26 | Disposition: A | Discharge status: Home / Self Care | Payer: Managed Care, Other (non HMO) | Attending: Emergency Medicine | Admitting: Emergency Medicine

## 2014-09-26 DIAGNOSIS — Z720 Tobacco use: Secondary | ICD-10-CM | POA: Insufficient documentation

## 2014-09-26 DIAGNOSIS — Y9389 Activity, other specified: Secondary | ICD-10-CM | POA: Insufficient documentation

## 2014-09-26 DIAGNOSIS — Y9289 Other specified places as the place of occurrence of the external cause: Secondary | ICD-10-CM | POA: Insufficient documentation

## 2014-09-26 DIAGNOSIS — T22112A Burn of first degree of left forearm, initial encounter: Secondary | ICD-10-CM | POA: Insufficient documentation

## 2014-09-26 DIAGNOSIS — Z79899 Other long term (current) drug therapy: Secondary | ICD-10-CM | POA: Insufficient documentation

## 2014-09-26 DIAGNOSIS — Y999 Unspecified external cause status: Secondary | ICD-10-CM | POA: Insufficient documentation

## 2014-09-26 DIAGNOSIS — T23262A Burn of second degree of back of left hand, initial encounter: Secondary | ICD-10-CM | POA: Insufficient documentation

## 2014-09-26 DIAGNOSIS — T22232A Burn of second degree of left upper arm, initial encounter: Secondary | ICD-10-CM | POA: Insufficient documentation

## 2014-09-26 DIAGNOSIS — Z792 Long term (current) use of antibiotics: Secondary | ICD-10-CM | POA: Insufficient documentation

## 2014-09-26 DIAGNOSIS — S0532XA Ocular laceration without prolapse or loss of intraocular tissue, left eye, initial encounter: Secondary | ICD-10-CM | POA: Insufficient documentation

## 2014-09-26 DIAGNOSIS — S0181XA Laceration without foreign body of other part of head, initial encounter: Secondary | ICD-10-CM | POA: Insufficient documentation

## 2014-09-26 DIAGNOSIS — T23202A Burn of second degree of left hand, unspecified site, initial encounter: Secondary | ICD-10-CM

## 2014-09-26 MED ORDER — SILVER SULFADIAZINE 1 % EX CREA
TOPICAL_CREAM | Freq: Once | CUTANEOUS | Status: AC
  Administered 2014-09-26: 05:00:00 via TOPICAL
  Filled 2014-09-26: qty 85

## 2014-09-26 MED ORDER — SILVER SULFADIAZINE 1 % EX CREA: 1.0000 "application " | TOPICAL_CREAM | Freq: Every day | CUTANEOUS | Status: DC

## 2014-09-26 MED ORDER — OXYCODONE-ACETAMINOPHEN 5-325 MG PO TABS: 1.0000 | ORAL_TABLET | ORAL | Status: DC | PRN

## 2014-09-26 MED ORDER — OXYCODONE-ACETAMINOPHEN 5-325 MG PO TABS
1.0000 | ORAL_TABLET | Freq: Once | ORAL | Status: AC
  Administered 2014-09-26: 1 via ORAL
  Filled 2014-09-26: qty 1

## 2014-09-26 MED ORDER — LIDOCAINE-EPINEPHRINE (PF) 2 %-1:200000 IJ SOLN
10.0000 mL | Freq: Once | INTRAMUSCULAR | Status: AC
  Administered 2014-09-26: 10 mL
  Filled 2014-09-26: qty 20

## 2014-09-26 NOTE — ED Notes (Signed)
Pt verbalized understanding of d/c instructions and has no further questions. Pt verbalized understanding to return for suture removal in 5-10 days. Pt stable and in NAD
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
## 2014-09-26 NOTE — ED Notes (Signed)
Dr Roxanne Mins suturing laceration on left forehead
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
## 2014-09-26 NOTE — ED Notes (Signed)
Dr Glick in room. 
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
## 2014-10-02 ENCOUNTER — Encounter (HOSPITAL_COMMUNITY): Payer: Self-pay | Admitting: Emergency Medicine

## 2014-10-02 ENCOUNTER — Emergency Department (HOSPITAL_COMMUNITY)
Admission: EM | Admit: 2014-10-02 | Discharge: 2014-10-02 | Disposition: A | Discharge status: Home / Self Care | Payer: Managed Care, Other (non HMO) | Attending: Emergency Medicine | Admitting: Emergency Medicine

## 2014-10-02 DIAGNOSIS — Z859 Personal history of malignant neoplasm, unspecified: Secondary | ICD-10-CM | POA: Insufficient documentation

## 2014-10-02 DIAGNOSIS — Z720 Tobacco use: Secondary | ICD-10-CM | POA: Insufficient documentation

## 2014-10-02 DIAGNOSIS — E119 Type 2 diabetes mellitus without complications: Secondary | ICD-10-CM | POA: Insufficient documentation

## 2014-10-02 DIAGNOSIS — Z79899 Other long term (current) drug therapy: Secondary | ICD-10-CM | POA: Insufficient documentation

## 2014-10-02 DIAGNOSIS — Z4802 Encounter for removal of sutures: Secondary | ICD-10-CM

## 2014-10-02 DIAGNOSIS — Z792 Long term (current) use of antibiotics: Secondary | ICD-10-CM | POA: Insufficient documentation

## 2014-10-02 DIAGNOSIS — I10 Essential (primary) hypertension: Secondary | ICD-10-CM | POA: Insufficient documentation
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
## 2014-10-02 NOTE — ED Notes (Signed)
Pt had stitches placed this past Wednesday to L eyebrow. Wound well approximated. No signs of infection.

## 2014-10-02 NOTE — Discharge Instructions (Signed)

## 2015-12-25 ENCOUNTER — Encounter: Payer: Self-pay | Admitting: *Deleted

## 2016-03-15 ENCOUNTER — Encounter (HOSPITAL_COMMUNITY): Payer: Self-pay | Admitting: *Deleted

## 2016-03-15 ENCOUNTER — Emergency Department (HOSPITAL_COMMUNITY)
Admission: EM | Admit: 2016-03-15 | Discharge: 2016-03-15 | Disposition: A | Discharge status: Home / Self Care | Payer: PRIVATE HEALTH INSURANCE | Attending: Emergency Medicine | Admitting: Emergency Medicine

## 2016-03-15 DIAGNOSIS — R112 Nausea with vomiting, unspecified: Secondary | ICD-10-CM

## 2016-03-15 DIAGNOSIS — K529 Noninfective gastroenteritis and colitis, unspecified: Secondary | ICD-10-CM | POA: Diagnosis not present

## 2016-03-15 DIAGNOSIS — F17290 Nicotine dependence, other tobacco product, uncomplicated: Secondary | ICD-10-CM | POA: Insufficient documentation

## 2016-03-15 LAB — URINALYSIS, ROUTINE W REFLEX MICROSCOPIC
Bilirubin Urine: NEGATIVE
GLUCOSE, UA: NEGATIVE mg/dL
HGB URINE DIPSTICK: NEGATIVE
Ketones, ur: NEGATIVE mg/dL
Leukocytes, UA: NEGATIVE
Nitrite: NEGATIVE
Protein, ur: NEGATIVE mg/dL
SPECIFIC GRAVITY, URINE: 1.025 (ref 1.005–1.030)
pH: 5 (ref 5.0–8.0)

## 2016-03-15 LAB — I-STAT CHEM 8, ED
BUN: 10 mg/dL (ref 6–20)
CHLORIDE: 104 mmol/L (ref 101–111)
CREATININE: 0.8 mg/dL (ref 0.44–1.00)
Calcium, Ion: 1.14 mmol/L — ABNORMAL LOW (ref 1.15–1.40)
Glucose, Bld: 100 mg/dL — ABNORMAL HIGH (ref 65–99)
HCT: 45 % (ref 36.0–46.0)
Hemoglobin: 15.3 g/dL — ABNORMAL HIGH (ref 12.0–15.0)
POTASSIUM: 3.9 mmol/L (ref 3.5–5.1)
Sodium: 138 mmol/L (ref 135–145)
TCO2: 23 mmol/L (ref 0–100)

## 2016-03-15 LAB — PREGNANCY, URINE: PREG TEST UR: NEGATIVE

## 2016-03-15 MED ORDER — PANTOPRAZOLE SODIUM 40 MG IV SOLR
40.0000 mg | Freq: Once | INTRAVENOUS | Status: AC
  Administered 2016-03-15: 40 mg via INTRAVENOUS
  Filled 2016-03-15: qty 40

## 2016-03-15 MED ORDER — ONDANSETRON 4 MG PO TBDP: 4.0000 mg | ORAL_TABLET | Freq: Three times a day (TID) | ORAL | 0 refills | Status: DC | PRN

## 2016-03-15 MED ORDER — DICYCLOMINE HCL 20 MG PO TABS: 20.0000 mg | ORAL_TABLET | Freq: Two times a day (BID) | ORAL | 0 refills | Status: DC

## 2016-03-15 MED ORDER — DIPHENOXYLATE-ATROPINE 2.5-0.025 MG PO TABS: 1.0000 | ORAL_TABLET | Freq: Four times a day (QID) | ORAL | 0 refills | Status: DC | PRN

## 2016-03-15 MED ORDER — DICYCLOMINE HCL 10 MG PO CAPS
10.0000 mg | ORAL_CAPSULE | Freq: Once | ORAL | Status: AC
Start: 2016-03-15 — End: 2016-03-15
  Administered 2016-03-15: 10 mg via ORAL
  Filled 2016-03-15: qty 1

## 2016-03-15 MED ORDER — ONDANSETRON HCL 4 MG/2ML IJ SOLN
4.0000 mg | Freq: Once | INTRAMUSCULAR | Status: AC
  Administered 2016-03-15: 4 mg via INTRAVENOUS
  Filled 2016-03-15: qty 2

## 2016-03-15 MED ORDER — SODIUM CHLORIDE 0.9 % IV BOLUS (SEPSIS)
1000.0000 mL | Freq: Once | INTRAVENOUS | Status: AC
  Administered 2016-03-15: 1000 mL via INTRAVENOUS

## 2016-03-15 NOTE — ED Notes (Signed)
Discharge instructions, follow up care, and rx x3 reviewed with patient. Patient verbalized understanding. 

## 2016-03-15 NOTE — ED Provider Notes (Signed)
[facility] DEPT Provider Note   CSN: [id number] Arrival date & time: 03/15/16  [id number]     History   Chief Complaint Chief Complaint  Patient presents with  . Influenza    HPI Kimberly Ponce is a 20 y.o. female. She waking this morning with nausea and vomiting and diarrhea. Has had abdominal cramping is generalized. No blood pus or mucus in her stools. Heme-negative nonbilious emesis. No fevers. No symptoms prior to yesterday. No known exposures. 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  HPI  History reviewed. No pertinent past medical history.  There are no active problems to display for this patient.   History reviewed. No pertinent surgical history.  OB History    No data available       Home Medications    Prior to Admission medications   Medication Sig Start Date End Date Taking? Authorizing Provider  ibuprofen (ADVIL,MOTRIN) 200 MG tablet Take 400 mg by mouth every 6 (six) hours as needed for headache, moderate pain or cramping (menstrual pain).    Yes Historical Provider, MD  dicyclomine (BENTYL) 20 MG tablet Take 1 tablet (20 mg total) by mouth 2 (two) times daily. 03/15/16   Tanna Furry, MD  diphenoxylate-atropine (LOMOTIL) 2.5-0.025 MG tablet Take 1 tablet by mouth 4 (four) times daily as needed for diarrhea or loose stools. 03/15/16   Tanna Furry, MD  doxycycline (VIBRAMYCIN) 100 MG capsule Take 1 capsule (100 mg total) by mouth 2 (two) times daily. Patient not taking: Reported on 03/15/2016 05/21/14   Varney Biles, MD  famotidine (PEPCID) 40 MG tablet Take 1 tablet (40 mg total) by mouth daily. Patient not taking: Reported on 03/15/2016 05/21/14   Varney Biles, MD  HYDROcodone-acetaminophen (NORCO/VICODIN) 5-325 MG per tablet Take 1 tablet by mouth every 6 (six) hours as needed for moderate pain or severe pain. Patient not taking: Reported on 03/15/2016 03/24/14   Loma Sousa Forcucci, PA-C  ondansetron (ZOFRAN ODT) 4 MG disintegrating tablet Take 1  tablet (4 mg total) by mouth every 8 (eight) hours as needed for nausea. 03/15/16   Tanna Furry, MD  oxyCODONE-acetaminophen (PERCOCET) 5-325 MG per tablet Take 1 tablet by mouth every 4 (four) hours as needed for moderate pain. Patient not taking: Reported on 03/15/2016 [id number]   Delora Fuel, MD  promethazine (PHENERGAN) 25 MG tablet Take 1 tablet (25 mg total) by mouth every 6 (six) hours as needed for nausea. Patient not taking: Reported on 03/15/2016 05/21/14   Varney Biles, MD  silver sulfADIAZINE (SILVADENE) 1 % cream Apply 1 application topically daily. Patient not taking: Reported on 03/15/2016 [id number]   Delora Fuel, MD  traMADol (ULTRAM) 50 MG tablet Take 1 tablet (50 mg total) by mouth every 6 (six) hours as needed. Patient not taking: Reported on 03/15/2016 05/21/14   Varney Biles, MD    Family History Family History  Problem Relation Age of Onset  . Cancer Father   . Hypertension Father   . Cancer Other   . Diabetes Other     Social History Social History  Substance Use Topics  . Smoking status: Current Every Day Smoker    Types: Cigars  . Smokeless tobacco: Never Used  . Alcohol use Yes     Comment: occ     Allergies   Other   Review of Systems Review of Systems  Constitutional: Negative for appetite change, chills, diaphoresis, fatigue and fever.  HENT: Negative for mouth sores, sore throat and trouble swallowing.  Eyes: Negative for visual disturbance.  Respiratory: Negative for cough, chest tightness, shortness of breath and wheezing.   Cardiovascular: Negative for chest pain.  Gastrointestinal: Positive for abdominal pain, diarrhea, nausea and vomiting. Negative for abdominal distention.  Endocrine: Negative for polydipsia, polyphagia and polyuria.  Genitourinary: Negative for dysuria, frequency and hematuria.  Musculoskeletal: Negative for gait problem.  Skin: Negative for color change, pallor and rash.  Neurological: Negative for dizziness, syncope,  light-headedness and headaches.  Hematological: Does not bruise/bleed easily.  Psychiatric/Behavioral: Negative for behavioral problems and confusion.     Physical Exam Updated Vital Signs BP 95/67 (BP Location: Left Arm)   Pulse 87   Temp 98.5 F (36.9 C) (Oral)   Resp 16   Ht 5\' 2"  (1.575 m)   Wt 190 lb (86.2 kg)   LMP 02/17/2016   SpO2 99%   BMI 34.75 kg/m   Physical Exam  Constitutional: She is oriented to person, place, and time. She appears well-developed and well-nourished. No distress.  HENT:  Head: Normocephalic.  Eyes: Conjunctivae are normal. Pupils are equal, round, and reactive to light. No scleral icterus.  Neck: Normal range of motion. Neck supple. No thyromegaly present.  Cardiovascular: Normal rate and regular rhythm.  Exam reveals no gallop and no friction rub.   No murmur heard. Pulmonary/Chest: Effort normal and breath sounds normal. No respiratory distress. She has no wheezes. She has no rales.  Abdominal: Soft. Bowel sounds are normal. She exhibits no distension. There is no tenderness. There is no rebound.  Normal active bowel sounds. No local tenderness. No guarding or rebound.  Musculoskeletal: Normal range of motion.  Neurological: She is alert and oriented to person, place, and time.  Skin: Skin is warm and dry. No rash noted.  Psychiatric: She has a normal mood and affect. Her behavior is normal.     ED Treatments / Results  Labs (all labs ordered are listed, but only abnormal results are displayed) Labs Reviewed  URINALYSIS, ROUTINE W REFLEX MICROSCOPIC - Abnormal; Notable for the following:       Result Value   APPearance HAZY (*)    All other components within normal limits  I-STAT CHEM 8, ED - Abnormal; Notable for the following:    Glucose, Bld 100 (*)    Calcium, Ion 1.14 (*)    Hemoglobin 15.3 (*)    All other components within normal limits  PREGNANCY, URINE    EKG  EKG Interpretation None       Radiology No results  found.  Procedures Procedures (including critical care time)  Medications Ordered in ED Medications  ondansetron (ZOFRAN) injection 4 mg (4 mg Intravenous Given 03/15/16 0810)  pantoprazole (PROTONIX) injection 40 mg (40 mg Intravenous Given 03/15/16 0810)  sodium chloride 0.9 % bolus 1,000 mL (0 mLs Intravenous Stopped 03/15/16 0907)  dicyclomine (BENTYL) capsule 10 mg (10 mg Oral Given 03/15/16 0931)     Initial Impression / Assessment and Plan / ED Course  I have reviewed the triage vital signs and the nursing notes.  Pertinent labs & imaging results that were available during my care of the patient were reviewed by me and considered in my medical decision making (see chart for details).  Clinical Course     Given IV fluids. Given Zofran and Lomotil. Her symptoms improved. Sinus occasional cramping. Given Bentyl. She is taking by mouth. Plan is home, clear liquids, Lomotil, Zofran, Bentyl when necessary. Advance diet. PCP follow-up if not improving. ER with acute changes  or worsening.  Final Clinical Impressions(s) / ED Diagnoses   Final diagnoses:  Gastroenteritis  Nausea and vomiting, intractability of vomiting not specified, unspecified vomiting type    New Prescriptions New Prescriptions   DICYCLOMINE (BENTYL) 20 MG TABLET    Take 1 tablet (20 mg total) by mouth 2 (two) times daily.   DIPHENOXYLATE-ATROPINE (LOMOTIL) 2.5-0.025 MG TABLET    Take 1 tablet by mouth 4 (four) times daily as needed for diarrhea or loose stools.   ONDANSETRON (ZOFRAN ODT) 4 MG DISINTEGRATING TABLET    Take 1 tablet (4 mg total) by mouth every 8 (eight) hours as needed for nausea.     Tanna Furry, MD 03/15/16 [phone]

## 2016-03-15 NOTE — ED Notes (Signed)
ED Provider at bedside. 

## 2016-03-15 NOTE — ED Triage Notes (Signed)
Patient is alert and oriented x4.  She is complaining of nausea and vomiting with diarrhea that started last night.  Patient works at a nursing home.  Currently she rates her pain 7 of 10 generalized.

## 2016-03-15 NOTE — Discharge Instructions (Signed)
Clear liquids Today, slowly advance your diet.  Lomotil for diarrhea, Bentyl for pain or cramps, Zofran for nausea.

## 2017-03-30 DIAGNOSIS — A599 Trichomoniasis, unspecified: Secondary | ICD-10-CM

## 2017-03-30 DIAGNOSIS — A549 Gonococcal infection, unspecified: Secondary | ICD-10-CM

## 2017-03-30 HISTORY — DX: Trichomoniasis, unspecified: A59.9

## 2017-03-30 HISTORY — DX: Gonococcal infection, unspecified: A54.9

## 2017-04-22 ENCOUNTER — Inpatient Hospital Stay (HOSPITAL_COMMUNITY)
Admission: AD | Admit: 2017-04-22 | Discharge: 2017-04-22 | Disposition: A | Discharge status: Home / Self Care | Payer: Managed Care, Other (non HMO) | Source: Ambulatory Visit | Attending: Obstetrics and Gynecology | Admitting: Obstetrics and Gynecology

## 2017-04-22 ENCOUNTER — Encounter (HOSPITAL_COMMUNITY): Payer: Self-pay | Admitting: *Deleted

## 2017-04-22 DIAGNOSIS — F17210 Nicotine dependence, cigarettes, uncomplicated: Secondary | ICD-10-CM | POA: Insufficient documentation

## 2017-04-22 DIAGNOSIS — R1030 Lower abdominal pain, unspecified: Secondary | ICD-10-CM | POA: Insufficient documentation

## 2017-04-22 DIAGNOSIS — Z3202 Encounter for pregnancy test, result negative: Secondary | ICD-10-CM | POA: Insufficient documentation

## 2017-04-22 DIAGNOSIS — A5901 Trichomonal vulvovaginitis: Secondary | ICD-10-CM

## 2017-04-22 DIAGNOSIS — N911 Secondary amenorrhea: Secondary | ICD-10-CM

## 2017-04-22 LAB — URINALYSIS, ROUTINE W REFLEX MICROSCOPIC
BILIRUBIN URINE: NEGATIVE
Glucose, UA: NEGATIVE mg/dL
HGB URINE DIPSTICK: NEGATIVE
KETONES UR: NEGATIVE mg/dL
Leukocytes, UA: NEGATIVE
Nitrite: NEGATIVE
PROTEIN: NEGATIVE mg/dL
SPECIFIC GRAVITY, URINE: 1.026 (ref 1.005–1.030)
pH: 7 (ref 5.0–8.0)

## 2017-04-22 LAB — CBC WITH DIFFERENTIAL/PLATELET
BASOS PCT: 1 %
Basophils Absolute: 0 10*3/uL (ref 0.0–0.1)
Eosinophils Absolute: 0.1 10*3/uL (ref 0.0–0.7)
Eosinophils Relative: 1 %
HEMATOCRIT: 38.1 % (ref 36.0–46.0)
HEMOGLOBIN: 12.5 g/dL (ref 12.0–15.0)
Lymphocytes Relative: 34 %
Lymphs Abs: 2.3 10*3/uL (ref 0.7–4.0)
MCH: 30.3 pg (ref 26.0–34.0)
MCHC: 32.8 g/dL (ref 30.0–36.0)
MCV: 92.3 fL (ref 78.0–100.0)
MONOS PCT: 5 %
Monocytes Absolute: 0.3 10*3/uL (ref 0.1–1.0)
NEUTROS ABS: 3.9 10*3/uL (ref 1.7–7.7)
NEUTROS PCT: 59 %
Platelets: 330 10*3/uL (ref 150–400)
RBC: 4.13 MIL/uL (ref 3.87–5.11)
RDW: 13 % (ref 11.5–15.5)
WBC: 6.6 10*3/uL (ref 4.0–10.5)

## 2017-04-22 LAB — WET PREP, GENITAL
Clue Cells Wet Prep HPF POC: NONE SEEN
SPERM: NONE SEEN
YEAST WET PREP: NONE SEEN

## 2017-04-22 LAB — POCT PREGNANCY, URINE: Preg Test, Ur: NEGATIVE

## 2017-04-22 MED ORDER — METRONIDAZOLE 500 MG PO TABS
2000.0000 mg | ORAL_TABLET | Freq: Once | ORAL | Status: AC
  Administered 2017-04-22: 2000 mg via ORAL
  Filled 2017-04-22: qty 4

## 2017-04-22 NOTE — MAU Note (Signed)
Pt is a 22 yo female c/o spotting and cramping for 10 days but does not think it is her period. Period was due on 1/10 and spotting began a few days before that.  Neg HPT and pregnancy test on admission.

## 2017-04-22 NOTE — MAU Provider Note (Signed)
History     CSN: [id number]  Arrival date and time: 04/22/17 1208   First Provider Initiated Contact with Patient 04/22/17 1318      Chief Complaint  Patient presents with  . Abdominal Pain  . Vaginal Bleeding   HPI  Ms. Kimberly Ponce is a 22 y.o. who presents to MAU today with complaint of suprapubic pain for the last few days. She rates pain at 5/10 now. She has not taken anything for pain. She denies vaginal bleeding, discharge, fever or UTI symptoms today. She states 1 episode of unprotected intercourse around Christmas. She is late for her period. LMP 03/11/17.   OB History    No data available      History reviewed. No pertinent past medical history.  History reviewed. No pertinent surgical history.  Family History  Problem Relation Age of Onset  . Cancer Father   . Hypertension Father   . Cancer Other   . Diabetes Other     Social History   Tobacco Use  . Smoking status: Current Every Day Smoker    Types: Cigarettes  . Smokeless tobacco: Never Used  Substance Use Topics  . Alcohol use: Yes    Comment: occ  . Drug use: No    Allergies:  Allergies  Allergen Reactions  . Other Other (See Comments)    Dark chocolate: throat swells    Medications Prior to Admission  Medication Sig Dispense Refill Last Dose  . ibuprofen (ADVIL,MOTRIN) 200 MG tablet Take 400 mg by mouth every 6 (six) hours as needed for headache, moderate pain or cramping (menstrual pain).    prn    Review of Systems  Constitutional: Negative for fever.  Gastrointestinal: Positive for nausea. Negative for abdominal pain, constipation, diarrhea and vomiting.  Genitourinary: Positive for pelvic pain. Negative for dysuria, frequency, urgency, vaginal bleeding and vaginal discharge.     Physical Exam   Blood pressure 123/72, pulse 90, temperature 98.9 F (37.2 C), temperature source Oral, resp. rate 16, height 5\' 2"  (1.575 m), weight 226 lb (102.5 kg), last menstrual period  03/11/2017, SpO2 100 %.  Physical Exam  Nursing note and vitals reviewed. Constitutional: She is oriented to person, place, and time. She appears well-developed and well-nourished. No distress.  HENT:  Head: Normocephalic and atraumatic.  Cardiovascular: Normal rate.  Respiratory: Effort normal.  GI: Soft. She exhibits no distension and no mass. There is no tenderness. There is no rebound and no guarding.  Neurological: She is alert and oriented to person, place, and time.  Skin: Skin is warm and dry. No erythema.  Psychiatric: She has a normal mood and affect.   Results for orders placed or performed during the hospital encounter of 04/22/17 (from the past 24 hour(s))  Urinalysis, Routine w reflex microscopic     Status: None   Collection Time: 04/22/17 12:32 PM  Result Value Ref Range   Color, Urine YELLOW YELLOW   APPearance CLEAR CLEAR   Specific Gravity, Urine 1.026 1.005 - 1.030   pH 7.0 5.0 - 8.0   Glucose, UA NEGATIVE NEGATIVE mg/dL   Hgb urine dipstick NEGATIVE NEGATIVE   Bilirubin Urine NEGATIVE NEGATIVE   Ketones, ur NEGATIVE NEGATIVE mg/dL   Protein, ur NEGATIVE NEGATIVE mg/dL   Nitrite NEGATIVE NEGATIVE   Leukocytes, UA NEGATIVE NEGATIVE  Pregnancy, urine POC     Status: None   Collection Time: 04/22/17 12:55 PM  Result Value Ref Range   Preg Test, Ur NEGATIVE NEGATIVE  Wet  prep, genital     Status: Abnormal   Collection Time: 04/22/17  1:38 PM  Result Value Ref Range   Yeast Wet Prep HPF POC NONE SEEN NONE SEEN   Trich, Wet Prep PRESENT (A) NONE SEEN   Clue Cells Wet Prep HPF POC NONE SEEN NONE SEEN   WBC, Wet Prep HPF POC FEW (A) NONE SEEN   Sperm NONE SEEN   CBC with Differential/Platelet     Status: None   Collection Time: 04/22/17  1:48 PM  Result Value Ref Range   WBC 6.6 4.0 - 10.5 K/uL   RBC 4.13 3.87 - 5.11 MIL/uL   Hemoglobin 12.5 12.0 - 15.0 g/dL   HCT 38.1 36.0 - 46.0 %   MCV 92.3 78.0 - 100.0 fL   MCH 30.3 26.0 - 34.0 pg   MCHC 32.8 30.0 -  36.0 g/dL   RDW 13.0 11.5 - 15.5 %   Platelets 330 150 - 400 K/uL   Neutrophils Relative % 59 %   Neutro Abs 3.9 1.7 - 7.7 K/uL   Lymphocytes Relative 34 %   Lymphs Abs 2.3 0.7 - 4.0 K/uL   Monocytes Relative 5 %   Monocytes Absolute 0.3 0.1 - 1.0 K/uL   Eosinophils Relative 1 %   Eosinophils Absolute 0.1 0.0 - 0.7 K/uL   Basophils Relative 1 %   Basophils Absolute 0.0 0.0 - 0.1 K/uL    MAU Course  Procedures None  MDM UPT - negative UA, CBC, wet prep and GC/Chlamydia today   Assessment and Plan  A: Trichomonas  Secondary amenorrhea   P:  Discharge home Treated in MAU with 2G Flagyl Partner treatment and abstinence x 2 weeks advised  Safe sex practices discussed Patient advised to monitor periods over the next few months, if they remain irregular she should follow-up with [facility]  Patient may return to MAU as needed or if her condition were to change or worsen   Kerry Hough, PA-C 04/22/2017, 2:32 PM

## 2017-04-22 NOTE — Discharge Instructions (Signed)
Trichomonas Test Why am I having this test? The trichomonas test is done to diagnose trichomoniasis, an infection caused by an organism called Trichomonas. Trichomoniasis is a sexually transmitted infection (STI). In women, it causes vaginal infections. In men, it can cause the tube that carries urine (urethra) to become inflamed (urethritis). You may have this test as a part of a routine screening for STIs or if you have symptoms of trichomoniasis. To perform the test, your health care provider will take a sample of discharge. The sample is taken from the vagina or cervix in women and from the urethra in men. A urine sample can also be used for testing. What do the results mean? It is your responsibility to obtain your test results. Ask the lab or department performing the test when and how you will get your results. Contact your health care provider to discuss any questions you have about your results. Negative test results A negative test means you do not have trichomoniasis. Follow your health care provider's directions about any follow-up testing. Positive test results A positive test result means you have an active infection that needs to be treated with antibiotic medicine. All your current sexual partners must also be treated or it is likely you will get reinfected. If your test is positive, your health care provider will start you on medicine and may advise you to:  Not have sexual intercourse until your infection has cleared up.  Use a latex condom properly every time you have sexual intercourse.  Limit the number of sexual partners you have. The more partners you have, the greater your risk of contracting trichomoniasis or another STI.  Tell all sexual partners about your infection so they can also be treated and to prevent reinfection.  Talk with your health care provider to discuss your results, treatment options, and if necessary, the need for more tests. Talk with your health care  provider if you have any questions about your results. This information is not intended to replace advice given to you by your health care provider. Make sure you discuss any questions you have with your health care provider. Document Released: 04/18/2004 Document Revised: 10/16/2015 Document Reviewed: 03/28/2013 Elsevier Interactive Patient Education  2017 [place].  Secondary Amenorrhea Secondary amenorrhea is the stopping of menstrual flow for 3-6 months in a female who has previously had periods. There are many possible causes. Most of these causes are not serious. Usually, treating the underlying problem causing the loss of menses will return your periods to normal. What are the causes? Some common and uncommon causes of not menstruating include:  Malnutrition.  Low blood sugar (hypoglycemia).  Polycystic ovary disease.  Stress or fear.  Breastfeeding.  Hormone imbalance.  Ovarian failure.  Medicines.  Extreme obesity.  Cystic fibrosis.  Low body weight or drastic weight reduction from any cause.  Early menopause.  Removal of ovaries or uterus.  Contraceptives.  Illness.  Long-term (chronic) illnesses.  Cushing syndrome.  Thyroid problems.  Birth control pills, patches, or vaginal rings for birth control.  What increases the risk? You may be at greater risk of secondary amenorrhea if:  You have a family history of this condition.  You have an eating disorder.  You do athletic training.  How is this diagnosed? A diagnosis is made by your health care provider taking a medical history and doing a physical exam. This will include a pelvic exam to check for problems with your reproductive organs. Pregnancy must be ruled out. Often,  numerous blood tests are done to measure different hormones in the body. Urine testing may be done. Specialized exams (ultrasound, CT scan, MRI, or hysteroscopy) may have to be done as well as measuring the body mass index  (BMI). How is this treated? Treatment depends on the cause of the amenorrhea. If an eating disorder is present, this can be treated with an adequate diet and therapy. Chronic illnesses may improve with treatment of the illness. Amenorrhea may be corrected with medicines, lifestyle changes, or surgery. If the amenorrhea cannot be corrected, it is sometimes possible to create a false menstruation with medicines. Follow these instructions at home:  Maintain a healthy diet.  Manage weight problems.  Exercise regularly but not excessively.  Get adequate sleep.  Manage stress.  Be aware of changes in your menstrual cycle. Keep a record of when your periods occur. Note the date your period starts, how long it lasts, and any problems. Contact a health care provider if: Your symptoms do not get better with treatment. This information is not intended to replace advice given to you by your health care provider. Make sure you discuss any questions you have with your health care provider. Document Released: 04/27/2006 Document Revised: 08/22/2015 Document Reviewed: 09/01/2012 Elsevier Interactive Patient Education  [employer].

## 2017-04-22 NOTE — MAU Provider Note (Signed)
History     CSN: [id number]  Arrival date and time: 04/22/17 1208   First Provider Initiated Contact with Patient 04/22/17 1318      Chief Complaint  Patient presents with  . Abdominal Pain  . Vaginal Bleeding   HPI Ms Kimberly Ponce is a nonpregnant 22yo G44P0 female who presents with abdominal pain and missed period. LMP was 03/11/17 with last episode of unprotected sex most around the holidays. No h/o of contraception use. She states that her periods have always been a regular, 30 day cycle until now. Home pregnancy test negative. Endorses lower abdominal pain for ~2weeks without any vaginal bleeding, discharge, odor. No dysuria, burning, itching. Normal BMs. Mild intermittent nausea without vomiting. No fever, chills, body aches. Tolerating PO well. Has not taken any medication for pain.   History reviewed. No pertinent past medical history.  History reviewed. No pertinent surgical history.  Family History  Problem Relation Age of Onset  . Cancer Father   . Hypertension Father   . Cancer Other   . Diabetes Other     Social History   Tobacco Use  . Smoking status: Current Every Day Smoker    Types: Cigarettes  . Smokeless tobacco: Never Used  Substance Use Topics  . Alcohol use: Yes    Comment: occ  . Drug use: No    Allergies:  Allergies  Allergen Reactions  . Other Other (See Comments)    Dark chocolate: throat swells    Medications Prior to Admission  Medication Sig Dispense Refill Last Dose  . ibuprofen (ADVIL,MOTRIN) 200 MG tablet Take 400 mg by mouth every 6 (six) hours as needed for headache, moderate pain or cramping (menstrual pain).    prn    Review of Systems  Constitutional: Negative for appetite change, chills, diaphoresis and fever.  Respiratory: Negative for cough and shortness of breath.   Cardiovascular: Negative for chest pain and palpitations.  Gastrointestinal: Positive for abdominal pain and nausea. Negative for blood in stool, constipation,  diarrhea and vomiting.  Genitourinary: Negative for dyspareunia, dysuria, pelvic pain, vaginal bleeding and vaginal pain.  Musculoskeletal: Negative for arthralgias and myalgias.  Neurological: Negative for dizziness, light-headedness and headaches.  All other systems reviewed and are negative.  Physical Exam   Blood pressure 123/72, pulse 90, temperature 98.9 F (37.2 C), temperature source Oral, resp. rate 16, height 5\' 2"  (1.575 m), weight 102.5 kg (226 lb), last menstrual period 03/11/2017, SpO2 100 %.  Physical Exam  Nursing note and vitals reviewed. Constitutional: She is oriented to person, place, and time. She appears well-developed and well-nourished. No distress.  Cardiovascular: Normal rate, regular rhythm and normal heart sounds.  No murmur heard. Respiratory: Effort normal and breath sounds normal.  GI: Soft. Bowel sounds are normal. She exhibits no distension and no mass. There is tenderness (Mild tenderness to deep palpation of  lower abdomen bilaterally.). There is no rebound.  Neurological: She is alert and oriented to person, place, and time.  Skin: Skin is warm and dry.  Psychiatric: She has a normal mood and affect. Her behavior is normal.   Results for orders placed or performed during the hospital encounter of 04/22/17 (from the past 24 hour(s))  Urinalysis, Routine w reflex microscopic     Status: None   Collection Time: 04/22/17 12:32 PM  Result Value Ref Range   Color, Urine YELLOW YELLOW   APPearance CLEAR CLEAR   Specific Gravity, Urine 1.026 1.005 - 1.030   pH 7.0 5.0 - 8.0  Glucose, UA NEGATIVE NEGATIVE mg/dL   Hgb urine dipstick NEGATIVE NEGATIVE   Bilirubin Urine NEGATIVE NEGATIVE   Ketones, ur NEGATIVE NEGATIVE mg/dL   Protein, ur NEGATIVE NEGATIVE mg/dL   Nitrite NEGATIVE NEGATIVE   Leukocytes, UA NEGATIVE NEGATIVE  Pregnancy, urine POC     Status: None   Collection Time: 04/22/17 12:55 PM  Result Value Ref Range   Preg Test, Ur NEGATIVE  NEGATIVE  Wet prep, genital     Status: Abnormal   Collection Time: 04/22/17  1:38 PM  Result Value Ref Range   Yeast Wet Prep HPF POC NONE SEEN NONE SEEN   Trich, Wet Prep PRESENT (A) NONE SEEN   Clue Cells Wet Prep HPF POC NONE SEEN NONE SEEN   WBC, Wet Prep HPF POC FEW (A) NONE SEEN   Sperm NONE SEEN   CBC with Differential/Platelet     Status: None   Collection Time: 04/22/17  1:48 PM  Result Value Ref Range   WBC 6.6 4.0 - 10.5 K/uL   RBC 4.13 3.87 - 5.11 MIL/uL   Hemoglobin 12.5 12.0 - 15.0 g/dL   HCT 38.1 36.0 - 46.0 %   MCV 92.3 78.0 - 100.0 fL   MCH 30.3 26.0 - 34.0 pg   MCHC 32.8 30.0 - 36.0 g/dL   RDW 13.0 11.5 - 15.5 %   Platelets 330 150 - 400 K/uL   Neutrophils Relative % 59 %   Neutro Abs 3.9 1.7 - 7.7 K/uL   Lymphocytes Relative 34 %   Lymphs Abs 2.3 0.7 - 4.0 K/uL   Monocytes Relative 5 %   Monocytes Absolute 0.3 0.1 - 1.0 K/uL   Eosinophils Relative 1 %   Eosinophils Absolute 0.1 0.0 - 0.7 K/uL   Basophils Relative 1 %   Basophils Absolute 0.0 0.0 - 0.1 K/uL    MAU Course  Procedures None  MDM MSE Exam Labs: CBC, UA w/UPT, GC/C and wet prep.  71F G0P0 presents with missed period and abdominal pain. LMP 03/11/17 with unprotected intercourse around the holidays. No h/o contraception. Previous periods have been regular, 30 day cycle. UPT was negative and UA completely clean. CBC WNL and physical exam was benign; no sign of appendicitis or other abdominal pathology. GC/C and wet prep collected, resulted +Trich. Will treat with 2g Flagyl and advised for partner to be treated too. Advised IBU as needed for pain. Discussed importance of safe sexual practices, but did not seem interested in birth control at this time.  Assessment and Plan   1. Trichomonas vaginitis   2. Secondary amenorrhea    - 2g Flagyl. Advised partner needs treatment as well. No sexual intercourse for 14 days. - Advised IBU for abdominal pain - Follow up if symptoms do not improve  within 1 week - Follow up in outpatient setting if menstrual irregularities continue  Rulon Eisenmenger PA-S 04/22/2017, 2:36 PM

## 2017-04-22 NOTE — MAU Note (Signed)
Pt reports sharp pains in her lower abd x 10 days, bleeding off/on

## 2017-04-23 LAB — GC/CHLAMYDIA PROBE AMP (~~LOC~~) NOT AT ARMC
CHLAMYDIA, DNA PROBE: NEGATIVE
Neisseria Gonorrhea: POSITIVE — AB

## 2017-04-30 ENCOUNTER — Inpatient Hospital Stay (HOSPITAL_COMMUNITY)
Admission: AD | Admit: 2017-04-30 | Discharge: 2017-04-30 | Disposition: A | Discharge status: Home / Self Care | Payer: Managed Care, Other (non HMO) | Source: Ambulatory Visit | Attending: Obstetrics and Gynecology | Admitting: Obstetrics and Gynecology

## 2017-04-30 DIAGNOSIS — F17210 Nicotine dependence, cigarettes, uncomplicated: Secondary | ICD-10-CM | POA: Insufficient documentation

## 2017-04-30 DIAGNOSIS — A64 Unspecified sexually transmitted disease: Secondary | ICD-10-CM | POA: Insufficient documentation

## 2017-04-30 MED ORDER — CEFTRIAXONE SODIUM 250 MG IJ SOLR
250.0000 mg | Freq: Once | INTRAMUSCULAR | Status: AC
  Administered 2017-04-30: 250 mg via INTRAMUSCULAR
  Filled 2017-04-30: qty 250

## 2017-04-30 MED ORDER — AZITHROMYCIN 250 MG PO TABS
1000.0000 mg | ORAL_TABLET | Freq: Once | ORAL | Status: AC
  Administered 2017-04-30: 1000 mg via ORAL
  Filled 2017-04-30: qty 4

## 2017-04-30 NOTE — MAU Note (Signed)
Pt was here sometime last week and was called with positive result of Gonorrhea. Told she could come in for treatment.

## 2017-04-30 NOTE — MAU Provider Note (Signed)
  History     CSN: [id number]  Arrival date and time: 04/30/17 1450   First Provider Initiated Contact with Patient 04/30/17 1647      Chief Complaint  Patient presents with  . Exposure to STD   22 y.o female presenting for STD treatment. States she was called and notified of +STD and told to come here for treatment. Partner has not been treated yet.     No past medical history on file.  No past surgical history on file.  Family History  Problem Relation Age of Onset  . Cancer Father   . Hypertension Father   . Cancer Other   . Diabetes Other     Social History   Tobacco Use  . Smoking status: Current Every Day Smoker    Types: Cigarettes  . Smokeless tobacco: Never Used  Substance Use Topics  . Alcohol use: Yes    Comment: occ  . Drug use: No    Allergies:  Allergies  Allergen Reactions  . Other Other (See Comments)    Dark chocolate: throat swells    No medications prior to admission.    Review of Systems  Gastrointestinal: Negative.   Genitourinary: Negative.    Physical Exam   Blood pressure 131/71, pulse 77, temperature 98.4 F (36.9 C), resp. rate 18, height 5\' 2"  (1.575 m), weight 225 lb (102.1 kg), last menstrual period 04/23/2017.  Physical Exam  Constitutional: She is oriented to person, place, and time. She appears well-developed and well-nourished. No distress.  HENT:  Head: Normocephalic.  Cardiovascular: Normal rate.  Respiratory: Effort normal. No respiratory distress.  Musculoskeletal: Normal range of motion.  Neurological: She is alert and oriented to person, place, and time.  Psychiatric: She has a normal mood and affect.   MAU Course  Procedures Azithromycin Rocephin  MDM Review of chart shows +GC and nursing note stating she was instructed to obtain treatment at [facility]. Will treat here today since pt presenting. Recommend partner treatment at [facility] and abstain from [place] or use condoms x3 weeks after both treated. Stable for  discharge home.   Assessment and Plan   1. STD (female)    Discharge home Follow up with PCP or GYN as needed  Allergies as of 04/30/2017      Reactions   Other Other (See Comments)   Dark chocolate: throat swells      Medication List    TAKE these medications   ibuprofen 200 MG tablet Commonly known as:  ADVIL,MOTRIN Take 400 mg by mouth every 6 (six) hours as needed for headache, moderate pain or cramping (menstrual pain).       Julianne Handler, CNM 04/30/2017, 5:05 PM

## 2018-10-15 ENCOUNTER — Emergency Department (HOSPITAL_COMMUNITY)
Admission: EM | Admit: 2018-10-15 | Discharge: 2018-10-15 | Disposition: A | Discharge status: Home / Self Care | Payer: BC Managed Care – PPO | Attending: Emergency Medicine | Admitting: Emergency Medicine

## 2018-10-15 ENCOUNTER — Emergency Department (HOSPITAL_COMMUNITY): Payer: BC Managed Care – PPO

## 2018-10-15 ENCOUNTER — Other Ambulatory Visit: Payer: Self-pay

## 2018-10-15 DIAGNOSIS — S61216A Laceration without foreign body of right little finger without damage to nail, initial encounter: Secondary | ICD-10-CM | POA: Insufficient documentation

## 2018-10-15 DIAGNOSIS — W260XXA Contact with knife, initial encounter: Secondary | ICD-10-CM | POA: Insufficient documentation

## 2018-10-15 DIAGNOSIS — F17210 Nicotine dependence, cigarettes, uncomplicated: Secondary | ICD-10-CM | POA: Diagnosis not present

## 2018-10-15 DIAGNOSIS — Z79899 Other long term (current) drug therapy: Secondary | ICD-10-CM | POA: Insufficient documentation

## 2018-10-15 DIAGNOSIS — S61214A Laceration without foreign body of right ring finger without damage to nail, initial encounter: Secondary | ICD-10-CM | POA: Diagnosis not present

## 2018-10-15 DIAGNOSIS — Y9389 Activity, other specified: Secondary | ICD-10-CM | POA: Insufficient documentation

## 2018-10-15 DIAGNOSIS — R52 Pain, unspecified: Secondary | ICD-10-CM

## 2018-10-15 DIAGNOSIS — Y929 Unspecified place or not applicable: Secondary | ICD-10-CM | POA: Diagnosis not present

## 2018-10-15 DIAGNOSIS — Y999 Unspecified external cause status: Secondary | ICD-10-CM | POA: Diagnosis not present

## 2018-10-15 MED ORDER — LIDOCAINE HCL (PF) 1 % IJ SOLN
10.0000 mL | Freq: Once | INTRAMUSCULAR | Status: AC
Start: 2018-10-15 — End: 2018-10-15
  Administered 2018-10-15: 10 mL via INTRADERMAL
  Filled 2018-10-15: qty 10

## 2018-10-15 NOTE — ED Notes (Signed)
Lidocaine bedside 

## 2018-10-15 NOTE — ED Provider Notes (Signed)
[facility] EMERGENCY DEPARTMENT Provider Note   CSN: [id number] Arrival date & time: 10/15/18  1211    History   Chief Complaint Chief Complaint  Patient presents with  . Finger Injury    HPI Kimberly Ponce is a 23 y.o. female who presents to the ED with lacerations to her right 4th and 5th digit that occurred at 10 AM this morning. Pt reports that she was grabbing a knife and accidentally sliced her fingers. Bleeding controlled. Tetanus up to date. Pt went to [facility] today and was sent here for further evaluation with concern for tendon involvement. She reports difficulty bending her right pinky finger.       No past medical history on file.  There are no active problems to display for this patient.   No past surgical history on file.   OB History   No obstetric history on file.      Home Medications    Prior to Admission medications   Medication Sig Start Date End Date Taking? Authorizing Provider  ibuprofen (ADVIL,MOTRIN) 200 MG tablet Take 400 mg by mouth every 6 (six) hours as needed for headache, moderate pain or cramping (menstrual pain).     [provider]    Family History Family History  Problem Relation Age of Onset  . Cancer Father   . Hypertension Father   . Cancer Other   . Diabetes Other     Social History Social History   Tobacco Use  . Smoking status: Current Every Day Smoker    Types: Cigarettes  . Smokeless tobacco: Never Used  Substance Use Topics  . Alcohol use: Yes    Comment: occ  . Drug use: No     Allergies   Other   Review of Systems Review of Systems  Musculoskeletal: Positive for arthralgias.  Skin: Positive for wound.     Physical Exam Updated Vital Signs BP 101/73 (BP Location: Left Arm)   Pulse 81   Temp 97.9 F (36.6 C) (Oral)   Resp 14   Ht 5\' 2"  (1.575 m)   Wt 99.8 kg   LMP 10/02/2018 (Approximate)   SpO2 98%   BMI 40.24 kg/m   Physical Exam Vitals  signs and nursing note reviewed.  Constitutional:      Appearance: She is not ill-appearing.  HENT:     Head: Normocephalic and atraumatic.  Eyes:     Conjunctiva/sclera: Conjunctivae normal.  Cardiovascular:     Rate and Rhythm: Normal rate and regular rhythm.  Pulmonary:     Effort: Pulmonary effort is normal.     Breath sounds: Normal breath sounds.  Musculoskeletal:     Comments: 2 cm laceration in a V shape to right ring finger along PIP joint; ROM intact throughout finger; cap refill < 2 seconds  1 cm laceration to middle phalange of right pinky finger; no tendon involvement along patient has difficulty with ROM of DIP joint; cap refill < 2 seconds; good distal pulses  Skin:    General: Skin is warm and dry.     Coloration: Skin is not jaundiced.  Neurological:     Mental Status: She is alert.          ED Treatments / Results  Labs (all labs ordered are listed, but only abnormal results are displayed) Labs Reviewed - No data to display  EKG None  Radiology Dg Hand Complete Right  Result Date: 10/15/2018 CLINICAL DATA:  Right hand laceration  and pain. EXAM: RIGHT HAND - COMPLETE 3+ VIEW COMPARISON:  None. FINDINGS: There is no evidence of fracture or dislocation. There is no evidence of arthropathy or other focal bone abnormality. Soft tissues are unremarkable. No radiopaque foreign body is noted. IMPRESSION: Negative. Electronically Signed   By: Marijo Conception M.D.   On: 10/15/2018 13:24    Procedures .Marland KitchenLaceration Repair  Date/Time: 10/15/2018 2:51 PM Performed by: Eustaquio Maize, PA-C Authorized by: Eustaquio Maize, PA-C   Consent:    Consent obtained:  Verbal   Consent given by:  Patient   Risks discussed:  Pain, poor cosmetic result and infection Anesthesia (see MAR for exact dosages):    Anesthesia method:  Local infiltration   Local anesthetic:  Lidocaine 1% w/o epi Laceration details:    Location:  Finger   Finger location:  R index finger    Length (cm):  2   Depth (mm):  2 Repair type:    Repair type:  Simple Pre-procedure details:    Preparation:  Patient was prepped and draped in usual sterile fashion Exploration:    Hemostasis achieved with:  Direct pressure   Wound exploration: wound explored through full range of motion   Treatment:    Area cleansed with:  Betadine   Irrigation solution:  Sterile saline Skin repair:    Repair method:  Sutures   Suture size:  5-0   Suture material:  Prolene   Suture technique:  Simple interrupted   Number of sutures:  5 Approximation:    Approximation:  Close Post-procedure details:    Dressing:  Non-adherent dressing   Patient tolerance of procedure:  Tolerated well, no immediate complications .Marland KitchenLaceration Repair  Date/Time: 10/15/2018 2:52 PM Performed by: Eustaquio Maize, PA-C Authorized by: Eustaquio Maize, PA-C   Consent:    Consent obtained:  Verbal   Consent given by:  Patient   Risks discussed:  Pain, infection and poor cosmetic result Anesthesia (see MAR for exact dosages):    Anesthesia method:  Local infiltration   Local anesthetic:  Lidocaine 1% w/o epi Laceration details:    Location:  Finger   Finger location:  R ring finger   Length (cm):  1   Depth (mm):  2 Repair type:    Repair type:  Simple Exploration:    Hemostasis achieved with:  Direct pressure   Wound exploration: wound explored through full range of motion and entire depth of wound probed and visualized   Treatment:    Area cleansed with:  Betadine   Irrigation solution:  Sterile saline Skin repair:    Repair method:  Sutures   Suture size:  5-0   Suture material:  Prolene   Suture technique:  Simple interrupted   Number of sutures:  3 Approximation:    Approximation:  Close Post-procedure details:    Dressing:  Non-adherent dressing   Patient tolerance of procedure:  Tolerated well, no immediate complications   (including critical care time)  Medications Ordered in ED  Medications  lidocaine (PF) (XYLOCAINE) 1 % injection 10 mL (10 mLs Intradermal Given by Other 10/15/18 1439)     Initial Impression / Assessment and Plan / ED Course  I have reviewed the triage vital signs and the nursing notes.  Pertinent labs & imaging results that were available during my care of the patient were reviewed by me and considered in my medical decision making (see chart for details).    23 year old otherwise healthy female presenting to the ED  with lacerations to right ring and pinky fingers after slicing it on a knife. Was seen at [facility] and sent here concerned for tendon involvement given she has difficulty bending the pinky finger. Tetanus up to date. Xrays obtained prior to being seen; negative for fracture. Nerve blocks performed to both fingers; afterwards patient was able to bend little finger at MCP and PIP joint; still having difficulty with DIP joint. No tendon exposure after wound probed thoroughly. Pt stitched up in the ED. Will give referral to hand surgeon Dr. Amedeo Plenty for further evaluation although suspect patient will gain movement back in the PIP joint prior to being seen. Pt advised to return in 5 days time for suture removal. She is in agreement with plan at this time and stable for discharge home.       Final Clinical Impressions(s) / ED Diagnoses   Final diagnoses:  Laceration of right ring finger without foreign body without damage to nail, initial encounter  Laceration of right little finger without foreign body without damage to nail, initial encounter    ED Discharge Orders    None       Eustaquio Maize, PA-C 10/15/18 1612    Daleen Bo, MD 10/17/18 1036

## 2018-10-15 NOTE — ED Notes (Signed)
Patient verbalizes understanding of discharge instructions. Opportunity for questioning and answers were provided. Armband removed by staff, pt discharged from ED.  

## 2018-10-15 NOTE — ED Triage Notes (Signed)
Pt here from [facility] for evaluation of lac to fourth and fifth digits on R hand. Pt caught a kitchen knife this morning as it was falling. Tetanus UTD.

## 2018-10-15 NOTE — ED Notes (Signed)
Patient transported to X-ray 

## 2018-10-15 NOTE — Discharge Instructions (Signed)
Please follow up with hand surgeon Dr. Amedeo Plenty if you continue to have difficulty moving your pinky finger You will need to have the stitches removed in 5 days times; you may return to the ED to do so or go to any urgent care Return to the ED immediately if you have redness around the wound, swelling, notice drainage of pus, or if you develop fevers

## 2018-10-21 ENCOUNTER — Other Ambulatory Visit: Payer: Self-pay

## 2018-10-21 ENCOUNTER — Emergency Department (HOSPITAL_COMMUNITY)
Admission: EM | Admit: 2018-10-21 | Discharge: 2018-10-21 | Disposition: A | Discharge status: Home / Self Care | Payer: BC Managed Care – PPO | Attending: Emergency Medicine | Admitting: Emergency Medicine

## 2018-10-21 DIAGNOSIS — S61216D Laceration without foreign body of right little finger without damage to nail, subsequent encounter: Secondary | ICD-10-CM | POA: Insufficient documentation

## 2018-10-21 DIAGNOSIS — F17210 Nicotine dependence, cigarettes, uncomplicated: Secondary | ICD-10-CM | POA: Diagnosis not present

## 2018-10-21 DIAGNOSIS — Z4802 Encounter for removal of sutures: Secondary | ICD-10-CM | POA: Diagnosis present

## 2018-10-21 DIAGNOSIS — S61214D Laceration without foreign body of right ring finger without damage to nail, subsequent encounter: Secondary | ICD-10-CM | POA: Insufficient documentation

## 2018-10-21 DIAGNOSIS — X58XXXD Exposure to other specified factors, subsequent encounter: Secondary | ICD-10-CM | POA: Insufficient documentation

## 2018-10-21 NOTE — ED Triage Notes (Signed)
Return visit for suture removal 6 days post lac repair. Sutures to fourth and fifth digit on right hand.

## 2018-10-21 NOTE — ED Provider Notes (Signed)
[facility] DEPT Provider Note   CSN: [id number] Arrival date & time: 10/21/18  2103    History   Chief Complaint Chief Complaint  Patient presents with  . Wound Check    HPI Kimberly Ponce is a 23 y.o. female presenting with suture removal.  Patient states that about 6 days ago, she accidentally grabbed a knife with her right hand and had a laceration of the right fourth and fifth fingers. She had sutures placed in the ED. Denies any purulent drainage or fever or pain. Tdap is uptodate per patient.         The history is provided by the patient.    No past medical history on file.  There are no active problems to display for this patient.   No past surgical history on file.   OB History   No obstetric history on file.      Home Medications    Prior to Admission medications   Medication Sig Start Date End Date Taking? Authorizing Provider  ibuprofen (ADVIL,MOTRIN) 200 MG tablet Take 400 mg by mouth every 6 (six) hours as needed for headache, moderate pain or cramping (menstrual pain).     [provider]    Family History Family History  Problem Relation Age of Onset  . Cancer Father   . Hypertension Father   . Cancer Other   . Diabetes Other     Social History Social History   Tobacco Use  . Smoking status: Current Every Day Smoker    Types: Cigarettes  . Smokeless tobacco: Never Used  Substance Use Topics  . Alcohol use: Yes    Comment: occ  . Drug use: No     Allergies   Other   Review of Systems Review of Systems  Skin: Positive for wound.  All other systems reviewed and are negative.    Physical Exam Updated Vital Signs BP 132/89 (BP Location: Right Arm)   Pulse 62   Temp 99.1 F (37.3 C) (Oral)   Resp 18   Ht 5\' 2"  (1.575 m)   Wt 99.8 kg   LMP 10/02/2018 (Approximate)   SpO2 97%   BMI 40.24 kg/m   Physical Exam Vitals signs and nursing note reviewed.  HENT:     Head:  Normocephalic.     Nose: Nose normal.     Mouth/Throat:     Mouth: Mucous membranes are moist.  Eyes:     Pupils: Pupils are equal, round, and reactive to light.  Neck:     Musculoskeletal: Normal range of motion.  Cardiovascular:     Rate and Rhythm: Normal rate.     Pulses: Normal pulses.  Pulmonary:     Effort: Pulmonary effort is normal.  Abdominal:     General: Abdomen is flat.  Musculoskeletal:     Comments: R 4th finger with 2 cm laceration that is V shaped with 5 sutures in place. R 4th finger with 1 cm laceration with 3 sutures in place. No signs of cellulitis or purulent drainage. Able to flex and extend fingers   Neurological:     General: No focal deficit present.     Mental Status: She is alert.  Psychiatric:        Mood and Affect: Mood normal.      ED Treatments / Results  Labs (all labs ordered are listed, but only abnormal results are displayed) Labs Reviewed - No data to display  EKG None  Radiology  No results found.  Procedures .Suture Removal  Date/Time: 10/21/2018 10:08 PM Performed by: Drenda Freeze, MD Authorized by: Drenda Freeze, MD   Consent:    Consent obtained:  Verbal   Consent given by:  Patient   Risks discussed:  Wound separation   Alternatives discussed:  No treatment Location:    Location:  Upper extremity   Upper extremity location:  Hand   Hand location:  R hand Procedure details:    Wound appearance:  No signs of infection and good wound healing   Number of sutures removed:  8 Post-procedure details:    Post-removal:  Band-Aid applied   Patient tolerance of procedure:  Tolerated well, no immediate complications   (including critical care time)   Medications Ordered in ED Medications - No data to display   Initial Impression / Assessment and Plan / ED Course  I have reviewed the triage vital signs and the nursing notes.  Pertinent labs & imaging results that were available during my care of the patient  were reviewed by me and considered in my medical decision making (see chart for details).        Kimberly Ponce is a 23 y.o. female here with suture removal. Wound is healing well, no signs of infection. Sutures removed. Stable for discharge    Final Clinical Impressions(s) / ED Diagnoses   Final diagnoses:  Visit for suture removal    ED Discharge Orders    None       Drenda Freeze, MD 10/21/18 2209

## 2018-10-21 NOTE — ED Notes (Signed)
Pt has stitches in rt hand ring and middle finger, no s/s of infection to report at this time.

## 2018-10-21 NOTE — Discharge Instructions (Signed)
Keep wound clean and dry.   It is healing appropriately right now   See your doctor  Return to ER if you have fever, uncontrolled bleeding, purulent drainage

## 2019-12-10 ENCOUNTER — Inpatient Hospital Stay (HOSPITAL_COMMUNITY): Payer: Medicaid Other

## 2019-12-10 ENCOUNTER — Inpatient Hospital Stay (HOSPITAL_COMMUNITY)
Admission: AD | Admit: 2019-12-10 | Discharge: 2019-12-10 | Disposition: A | Discharge status: Home / Self Care | Payer: Medicaid Other | Attending: Obstetrics and Gynecology | Admitting: Obstetrics and Gynecology

## 2019-12-10 ENCOUNTER — Other Ambulatory Visit: Payer: Self-pay

## 2019-12-10 ENCOUNTER — Encounter (HOSPITAL_COMMUNITY): Payer: Self-pay | Admitting: Obstetrics and Gynecology

## 2019-12-10 DIAGNOSIS — R109 Unspecified abdominal pain: Secondary | ICD-10-CM | POA: Diagnosis not present

## 2019-12-10 DIAGNOSIS — Z3A01 Less than 8 weeks gestation of pregnancy: Secondary | ICD-10-CM | POA: Diagnosis not present

## 2019-12-10 DIAGNOSIS — Z87891 Personal history of nicotine dependence: Secondary | ICD-10-CM | POA: Insufficient documentation

## 2019-12-10 DIAGNOSIS — O26891 Other specified pregnancy related conditions, first trimester: Secondary | ICD-10-CM | POA: Insufficient documentation

## 2019-12-10 DIAGNOSIS — O3680X0 Pregnancy with inconclusive fetal viability, not applicable or unspecified: Secondary | ICD-10-CM | POA: Diagnosis not present

## 2019-12-10 DIAGNOSIS — N898 Other specified noninflammatory disorders of vagina: Secondary | ICD-10-CM | POA: Diagnosis not present

## 2019-12-10 DIAGNOSIS — O3411 Maternal care for benign tumor of corpus uteri, first trimester: Secondary | ICD-10-CM | POA: Diagnosis not present

## 2019-12-10 DIAGNOSIS — Z6790 Unspecified blood type, Rh positive: Secondary | ICD-10-CM

## 2019-12-10 DIAGNOSIS — D259 Leiomyoma of uterus, unspecified: Secondary | ICD-10-CM | POA: Diagnosis not present

## 2019-12-10 LAB — CBC
HCT: 37.4 % (ref 36.0–46.0)
Hemoglobin: 12.2 g/dL (ref 12.0–15.0)
MCH: 30 pg (ref 26.0–34.0)
MCHC: 32.6 g/dL (ref 30.0–36.0)
MCV: 92.1 fL (ref 80.0–100.0)
Platelets: 364 10*3/uL (ref 150–400)
RBC: 4.06 MIL/uL (ref 3.87–5.11)
RDW: 13.2 % (ref 11.5–15.5)
WBC: 5.7 10*3/uL (ref 4.0–10.5)
nRBC: 0 % (ref 0.0–0.2)

## 2019-12-10 LAB — POCT PREGNANCY, URINE: Preg Test, Ur: POSITIVE — AB

## 2019-12-10 LAB — WET PREP, GENITAL
Clue Cells Wet Prep HPF POC: NONE SEEN
Sperm: NONE SEEN
Trich, Wet Prep: NONE SEEN
Yeast Wet Prep HPF POC: NONE SEEN

## 2019-12-10 LAB — URINALYSIS, ROUTINE W REFLEX MICROSCOPIC
Bilirubin Urine: NEGATIVE
Glucose, UA: NEGATIVE mg/dL
Hgb urine dipstick: NEGATIVE
Ketones, ur: NEGATIVE mg/dL
Leukocytes,Ua: NEGATIVE
Nitrite: NEGATIVE
Protein, ur: NEGATIVE mg/dL
Specific Gravity, Urine: 1.029 (ref 1.005–1.030)
pH: 6 (ref 5.0–8.0)

## 2019-12-10 LAB — ABO/RH: ABO/RH(D): O POS

## 2019-12-10 LAB — HCG, QUANTITATIVE, PREGNANCY: hCG, Beta Chain, Quant, S: 666 m[IU]/mL — ABNORMAL HIGH (ref <5)

## 2019-12-10 NOTE — MAU Provider Note (Signed)
History     CSN: [id number]  Arrival date and time: 12/10/19 1733   First Provider Initiated Contact with Patient 12/10/19 1848      Chief Complaint  Patient presents with  . Abdominal Pain  . Vaginal Bleeding   24 y.o. G1 @[redacted]w[redacted]d  by sure LMP presenting with brown discharge and cramping. Cramping has been ongoing for 4 days but worse in the last 2. Describes as intermittent and sharp. Not having pain currently. Discharge has been brown for the last week. Denies itching or malodor. States chance for STD since having unprotected sex. Denies urinary sx.   OB History    Gravida  1   Para      Term      Preterm      AB      Living        SAB      TAB      Ectopic      Multiple      Live Births              Past Medical History:  Diagnosis Date  . Gonorrhea 2019  . Trichimoniasis 2019    Past Surgical History:  Procedure Laterality Date  . COSMETIC SURGERY     brazilian butt lift  . FINGER TENDON REPAIR      Family History  Problem Relation Age of Onset  . Cancer Father   . Hypertension Father   . Cancer Other   . Diabetes Other     Social History   Tobacco Use  . Smoking status: Former Smoker    Types: Cigarettes    Quit date: 11/29/2019    Years since quitting: 0.0  . Smokeless tobacco: Never Used  Vaping Use  . Vaping Use: Former  . Quit date: 11/29/2019  Substance Use Topics  . Alcohol use: Yes    Comment: occ  . Drug use: No    Allergies:  Allergies  Allergen Reactions  . Other Other (See Comments)    Dark chocolate: throat swells    Medications Prior to Admission  Medication Sig Dispense Refill Last Dose  . ibuprofen (ADVIL,MOTRIN) 200 MG tablet Take 400 mg by mouth every 6 (six) hours as needed for headache, moderate pain or cramping (menstrual pain).    Unknown at Unknown time    Review of Systems  Gastrointestinal: Positive for abdominal pain.  Genitourinary: Positive for vaginal discharge. Negative for dysuria, frequency,  urgency and vaginal bleeding.   Physical Exam   Blood pressure 119/73, pulse 90, temperature 98.9 F (37.2 C), temperature source Oral, resp. rate 16, height 5\' 2"  (1.575 m), weight 91 kg, last menstrual period 11/02/2019, SpO2 99 %.  Physical Exam Vitals and nursing note reviewed. Exam conducted with a chaperone present.  Constitutional:      General: She is not in acute distress.    Appearance: Normal appearance.  HENT:     Head: Normocephalic and atraumatic.  Cardiovascular:     Rate and Rhythm: Normal rate.  Pulmonary:     Effort: Pulmonary effort is normal. No respiratory distress.  Abdominal:     Palpations: Abdomen is soft.     Tenderness: There is no abdominal tenderness.  Genitourinary:    Comments: External: no lesions or erythema Vagina: rugated, pink, moist, scant brown bloody discharge Uterus: non enlarged, anteverted, non tender, no CMT Adnexae: no masses, no tenderness left, no tenderness right Cervix closed; ?polyp from os  Musculoskeletal:  General: Normal range of motion.     Cervical back: Normal range of motion.  Skin:    General: Skin is warm and dry.  Neurological:     General: No focal deficit present.     Mental Status: She is alert and oriented to person, place, and time.  Psychiatric:        Mood and Affect: Mood normal.    Results for orders placed or performed during the hospital encounter of 12/10/19 (from the past 24 hour(s))  Urinalysis, Routine w reflex microscopic Urine, Clean Catch     Status: None   Collection Time: 12/10/19  5:56 PM  Result Value Ref Range   Color, Urine YELLOW YELLOW   APPearance CLEAR CLEAR   Specific Gravity, Urine 1.029 1.005 - 1.030   pH 6.0 5.0 - 8.0   Glucose, UA NEGATIVE NEGATIVE mg/dL   Hgb urine dipstick NEGATIVE NEGATIVE   Bilirubin Urine NEGATIVE NEGATIVE   Ketones, ur NEGATIVE NEGATIVE mg/dL   Protein, ur NEGATIVE NEGATIVE mg/dL   Nitrite NEGATIVE NEGATIVE   Leukocytes,Ua NEGATIVE NEGATIVE   Pregnancy, urine POC     Status: Abnormal   Collection Time: 12/10/19  6:00 PM  Result Value Ref Range   Preg Test, Ur POSITIVE (A) NEGATIVE  ABO/Rh     Status: None   Collection Time: 12/10/19  6:41 PM  Result Value Ref Range   ABO/RH(D) O POS    No rh immune globuloin      NOT A RH IMMUNE GLOBULIN CANDIDATE, PT RH POSITIVE Performed at [facility], [place] [address], [place], [place] [address]   CBC     Status: None   Collection Time: 12/10/19  6:41 PM  Result Value Ref Range   WBC 5.7 4.0 - 10.5 K/uL   RBC 4.06 3.87 - 5.11 MIL/uL   Hemoglobin 12.2 12.0 - 15.0 g/dL   HCT 37.4 36 - 46 %   MCV 92.1 80.0 - 100.0 fL   MCH 30.0 26.0 - 34.0 pg   MCHC 32.6 30.0 - 36.0 g/dL   RDW 13.2 11.5 - 15.5 %   Platelets 364 150 - 400 K/uL   nRBC 0.0 0.0 - 0.2 %  hCG, quantitative, pregnancy     Status: Abnormal   Collection Time: 12/10/19  6:41 PM  Result Value Ref Range   hCG, Beta Chain, Quant, S 666 (H) <5 mIU/mL  Wet prep, genital     Status: Abnormal   Collection Time: 12/10/19  6:57 PM  Result Value Ref Range   Yeast Wet Prep HPF POC NONE SEEN NONE SEEN   Trich, Wet Prep NONE SEEN NONE SEEN   Clue Cells Wet Prep HPF POC NONE SEEN NONE SEEN   WBC, Wet Prep HPF POC MODERATE (A) NONE SEEN   Sperm NONE SEEN    US OB LESS THAN 14 WEEKS WITH OB TRANSVAGINAL  Result Date: 12/10/2019 CLINICAL DATA:  Initial evaluation for pelvic cramping, discharge. Positive beta HCG. EXAM: OBSTETRIC <14 WK [place] AND TRANSVAGINAL OB US TECHNIQUE: Both transabdominal and transvaginal ultrasound examinations were performed for complete evaluation of the gestation as well as the maternal uterus, adnexal regions, and pelvic cul-de-sac. Transvaginal technique was performed to assess early pregnancy. COMPARISON:  None. FINDINGS: Intrauterine gestational sac: Probable early gestational sac. Yolk sac:  Negative. Embryo:  Negative. Cardiac Activity: Negative. MSD: 3.3 mm   5 w   0 d Subchorionic hemorrhage:  None  visualized. Maternal uterus/adnexae: Ovaries within normal limits  bilaterally. Degenerating corpus luteal cyst noted within the right ovary. Moderate volume free fluid present within the pelvis. No adnexal mass. Note made of a 3.1 x 2.9 x 3.0 cm subserosal fibroid at the right uterine body. IMPRESSION: 1. Probable early intrauterine gestational sac, but no yolk sac, fetal pole, or cardiac activity yet visualized. Recommend follow-up quantitative B-HCG levels and follow-up US in 14 days to confirm and assess viability. This recommendation follows SRU consensus guidelines: Diagnostic Criteria for Nonviable Pregnancy Early in the First Trimester. [employer] Cor[employer] Med 2013; 220:2542-70. 2. Degenerating right ovarian corpus luteal cyst with associated moderate volume free fluid within the pelvis. 3. 3.1 cm fibroid at the right uterine body. Electronically Signed   By: Jeannine Boga M.D.   On: 12/10/2019 19:47   MAU Course  Procedures  MDM Labs and [place] ordered and reviewed. IUGS present but no YS or FP seen on [place], findings could indicate early pregnancy, ectopic pregnancy, or failed pregnancy, discussed with pt. Will follow quant after 48 hrs. GC pending. Stable for discharge home.   Assessment and Plan   1. Pregnancy, location unknown   2. Abdominal cramping affecting pregnancy   3. Blood type, Rh positive   4. Uterine leiomyoma, unspecified location    Discharge home Follow up [place] on 9/15 @0830  Ectopic/SAB precautions  Allergies as of 12/10/2019      Reactions   Other Other (See Comments)   Dark chocolate: throat swells      Medication List    STOP taking these medications   ibuprofen 200 MG tablet Commonly known as: ADVIL      Julianne Handler, CNM 12/10/2019, 8:10 PM

## 2019-12-10 NOTE — Discharge Instructions (Signed)
Abdominal Pain During Pregnancy  Belly (abdominal) pain is common during pregnancy. There are many possible causes. Most of the time, it is not a serious problem. Other times, it can be a sign that something is wrong with the pregnancy. Always tell your doctor if you have belly pain. Follow these instructions at home:  Do not have sex or put anything in your vagina until your pain goes away completely.  Get plenty of rest until your pain gets better.  Drink enough fluid to keep your pee (urine) pale yellow.  Take over-the-counter and prescription medicines only as told by your doctor.  Keep all follow-up visits as told by your doctor. This is important. Contact a doctor if:  Your pain continues or gets worse after resting.  You have lower belly pain that: ? Comes and goes at regular times. ? Spreads to your back. ? Feels like menstrual cramps.  You have pain or burning when you pee (urinate). Get help right away if:  You have a fever or chills.  You have vaginal bleeding.  You are leaking fluid from your vagina.  You are passing tissue from your vagina.  You throw up (vomit) for more than 24 hours.  You have watery poop (diarrhea) for more than 24 hours.  Your baby is moving less than usual.  You feel very weak or faint.  You have shortness of breath.  You have very bad pain in your upper belly. Summary  Belly (abdominal) pain is common during pregnancy. There are many possible causes.  If you have belly pain during pregnancy, tell your doctor right away.  Keep all follow-up visits as told by your doctor. This is important. This information is not intended to replace advice given to you by your health care provider. Make sure you discuss any questions you have with your health care provider. Document Revised: 07/04/2018 Document Reviewed: 06/18/2016 Elsevier Patient Education  2020 Elsevier Inc.  

## 2019-12-10 NOTE — MAU Note (Signed)
Kimberly Ponce is a 23 y.o. at [redacted]w[redacted]d here in MAU reporting: brown discharge since the 5th and cramping for the past 2 days.  LMP: 11/02/19  Onset of complaint: ongoing  Pain score: 6/10  Vitals:   12/10/19 1806  BP: 119/73  Pulse: 90  Resp: 16  Temp: 98.9 F (37.2 C)  SpO2: 99%     Lab orders placed from triage: UA, UPT

## 2019-12-11 LAB — GC/CHLAMYDIA PROBE AMP (~~LOC~~) NOT AT ARMC
Chlamydia: NEGATIVE
Comment: NEGATIVE
Comment: NORMAL
Neisseria Gonorrhea: NEGATIVE

## 2019-12-13 ENCOUNTER — Ambulatory Visit (INDEPENDENT_AMBULATORY_CARE_PROVIDER_SITE_OTHER): Payer: Medicaid Other

## 2019-12-13 ENCOUNTER — Other Ambulatory Visit: Payer: Self-pay

## 2019-12-13 DIAGNOSIS — Z3A00 Weeks of gestation of pregnancy not specified: Secondary | ICD-10-CM | POA: Diagnosis not present

## 2019-12-13 DIAGNOSIS — O3680X0 Pregnancy with inconclusive fetal viability, not applicable or unspecified: Secondary | ICD-10-CM

## 2019-12-13 LAB — BETA HCG QUANT (REF LAB): hCG Quant: 1415 m[IU]/mL

## 2019-12-13 NOTE — Progress Notes (Signed)
Kimberly Ponce presents to [facility] for follow-up quant hCG blood draw today. She was seen in MAU for brown discharge and abdominal cramping on 12/10/19. Reports some pinkish, brown discharge and mild, menstrual-like cramps since visit to MAU. Patient denies pain or bleeding today. Discussed with patient, we are following hCG levels today. Results will be back in approximately 2 hours. Valid contact number for patient confirmed. I will call the patient with results. Ectopic precautions reviewed.  Beta hCG today is 1415, which has risen from 666 on 12/10/19. Results and patient history reviewed with Ilda Basset, MD who states hCG is rising appropriately and pt should follow up with Korea 2 wks from initial Korea. Patient called and informed of plan for follow-up. Korea scheduled for 12/25/19 at 11; pt to arrive at 1045. Nurse visit for Korea results will be scheduled to follow.   Annabell Howells 12/13/2019

## 2019-12-14 NOTE — Progress Notes (Signed)
Patient was assessed and managed by nursing staff during this encounter. I have reviewed the chart and agree with the documentation and plan. I have also made any necessary editorial changes.  Aletha Halim, MD 12/14/2019 10:58 PM

## 2019-12-19 ENCOUNTER — Inpatient Hospital Stay (HOSPITAL_COMMUNITY): Payer: Medicaid Other

## 2019-12-19 ENCOUNTER — Encounter (HOSPITAL_COMMUNITY): Payer: Self-pay | Admitting: Family Medicine

## 2019-12-19 ENCOUNTER — Other Ambulatory Visit: Payer: Self-pay

## 2019-12-19 ENCOUNTER — Inpatient Hospital Stay (HOSPITAL_COMMUNITY)
Admission: AD | Admit: 2019-12-19 | Discharge: 2019-12-19 | Disposition: A | Discharge status: Home / Self Care | Payer: Medicaid Other | Attending: Family Medicine | Admitting: Family Medicine

## 2019-12-19 DIAGNOSIS — Z87891 Personal history of nicotine dependence: Secondary | ICD-10-CM | POA: Insufficient documentation

## 2019-12-19 DIAGNOSIS — O99891 Other specified diseases and conditions complicating pregnancy: Secondary | ICD-10-CM

## 2019-12-19 DIAGNOSIS — O23591 Infection of other part of genital tract in pregnancy, first trimester: Secondary | ICD-10-CM | POA: Insufficient documentation

## 2019-12-19 DIAGNOSIS — Z3A01 Less than 8 weeks gestation of pregnancy: Secondary | ICD-10-CM | POA: Diagnosis not present

## 2019-12-19 DIAGNOSIS — N760 Acute vaginitis: Secondary | ICD-10-CM

## 2019-12-19 DIAGNOSIS — B9689 Other specified bacterial agents as the cause of diseases classified elsewhere: Secondary | ICD-10-CM

## 2019-12-19 DIAGNOSIS — O209 Hemorrhage in early pregnancy, unspecified: Secondary | ICD-10-CM | POA: Diagnosis not present

## 2019-12-19 DIAGNOSIS — N939 Abnormal uterine and vaginal bleeding, unspecified: Secondary | ICD-10-CM

## 2019-12-19 LAB — URINALYSIS, ROUTINE W REFLEX MICROSCOPIC
Bilirubin Urine: NEGATIVE
Glucose, UA: NEGATIVE mg/dL
Hgb urine dipstick: NEGATIVE
Ketones, ur: NEGATIVE mg/dL
Leukocytes,Ua: NEGATIVE
Nitrite: NEGATIVE
Protein, ur: NEGATIVE mg/dL
Specific Gravity, Urine: 1.021 (ref 1.005–1.030)
pH: 6 (ref 5.0–8.0)

## 2019-12-19 LAB — WET PREP, GENITAL
Sperm: NONE SEEN
Trich, Wet Prep: NONE SEEN
Yeast Wet Prep HPF POC: NONE SEEN

## 2019-12-19 LAB — HCG, QUANTITATIVE, PREGNANCY: hCG, Beta Chain, Quant, S: 5383 m[IU]/mL — ABNORMAL HIGH (ref <5)

## 2019-12-19 MED ORDER — PREPLUS 27-1 MG PO TABS: 1.0000 | ORAL_TABLET | Freq: Every day | ORAL | 13 refills | Status: DC

## 2019-12-19 MED ORDER — METRONIDAZOLE 500 MG PO TABS: 500.0000 mg | ORAL_TABLET | Freq: Two times a day (BID) | ORAL | 0 refills | Status: DC

## 2019-12-19 NOTE — Discharge Instructions (Signed)

## 2019-12-19 NOTE — MAU Provider Note (Signed)
Chief Complaint: Vaginal Bleeding and Abdominal Pain   First Provider Initiated Contact with Patient 12/19/19 1814     SUBJECTIVE HPI: Kimberly Ponce is a 24 y.o. G1P0 at [redacted]w[redacted]d who presents to [facility] Admissions reporting vaginal spotting that has been going on for a couple of weeks intermittently. It began as dark brown, then pink, and is now brighter red. She also endorses mild cramping. She has been seen at [facility] for a follow up ultrasound (1 week ago) and uptrending HCG.  Past Medical History:  Diagnosis Date  . Gonorrhea 2019  . Trichimoniasis 2019   OB History  Gravida Para Term Preterm AB Living  1            SAB TAB Ectopic Multiple Live Births               # Outcome Date GA Lbr Len/2nd Weight Sex Delivery Anes PTL Lv  1 Current            Past Surgical History:  Procedure Laterality Date  . COSMETIC SURGERY     brazilian butt lift  . FINGER TENDON REPAIR     Social History   Socioeconomic History  . Marital status: Single    Spouse name: Not on file  . Number of children: Not on file  . Years of education: Not on file  . Highest education level: Not on file  Occupational History  . Not on file  Tobacco Use  . Smoking status: Former Smoker    Types: Cigarettes    Quit date: 11/29/2019    Years since quitting: 0.0  . Smokeless tobacco: Never Used  Vaping Use  . Vaping Use: Former  . Quit date: 11/29/2019  Substance and Sexual Activity  . Alcohol use: Not Currently    Comment: occ  . Drug use: No  . Sexual activity: Yes    Birth control/protection: None  Other Topics Concern  . Not on file  Social History Narrative  . Not on file   Social Determinants of Health   Financial Resource Strain:   . Difficulty of Paying Living Expenses: Not on file  Food Insecurity: No Food Insecurity  . Worried About Charity fundraiser in the Last Year: Never true  . Ran Out of Food in the Last Year: Never true  Transportation Needs: No Transportation Needs  .  Lack of Transportation (Medical): No  . Lack of Transportation (Non-Medical): No  Physical Activity:   . Days of Exercise per Week: Not on file  . Minutes of Exercise per Session: Not on file  Stress:   . Feeling of Stress : Not on file  Social Connections:   . Frequency of Communication with Friends and Family: Not on file  . Frequency of Social Gatherings with Friends and Family: Not on file  . Attends Religious Services: Not on file  . Active Member of Clubs or Organizations: Not on file  . Attends Archivist Meetings: Not on file  . Marital Status: Not on file  Intimate Partner Violence:   . Fear of Current or Ex-Partner: Not on file  . Emotionally Abused: Not on file  . Physically Abused: Not on file  . Sexually Abused: Not on file   Family History  Problem Relation Age of Onset  . Cancer Father   . Hypertension Father   . Cancer Other   . Diabetes Other    No current facility-administered medications on file prior to encounter.  No current outpatient medications on file prior to encounter.   Allergies  Allergen Reactions  . Other Other (See Comments)    Dark chocolate: throat swells    I have reviewed patient's Past Medical Hx, Surgical Hx, Family Hx, Social Hx, medications and allergies.   Review of Systems  Gastrointestinal: Negative for nausea and vomiting.  Genitourinary: Positive for pelvic pain and vaginal bleeding.  Neurological: Negative for dizziness and light-headedness.   OBJECTIVE Patient Vitals for the past 24 hrs:  BP Temp Pulse Resp SpO2 Weight  12/19/19 2025 125/62 -- 67 -- -- --  12/19/19 2024 -- -- -- 15 -- --  12/19/19 1947 115/72 -- 79 -- -- --  12/19/19 1737 126/69 98 F (36.7 C) 76 16 99 % 202 lb (91.6 kg)   Constitutional: Well-developed, well-nourished female in no acute distress.  Cardiovascular: normal rate & rhythm, no murmur Respiratory: normal rate and effort. Lung sounds clear throughout GI: Abd soft, non-tender,  Pos BS x 4. No guarding or rebound tenderness MS: Extremities nontender, no edema, normal ROM Neurologic: Alert and oriented x 4.  SPECULUM EXAM: NEFG, off-white discharge and blood noted BIMANUAL: No CMT, uterus normal size, no adnexal tenderness or masses.    LAB RESULTS Results for orders placed or performed during the hospital encounter of 12/19/19 (from the past 24 hour(s))  Urinalysis, Routine w reflex microscopic Urine, Clean Catch     Status: None   Collection Time: 12/19/19  5:30 PM  Result Value Ref Range   Color, Urine YELLOW YELLOW   APPearance CLEAR CLEAR   Specific Gravity, Urine 1.021 1.005 - 1.030   pH 6.0 5.0 - 8.0   Glucose, UA NEGATIVE NEGATIVE mg/dL   Hgb urine dipstick NEGATIVE NEGATIVE   Bilirubin Urine NEGATIVE NEGATIVE   Ketones, ur NEGATIVE NEGATIVE mg/dL   Protein, ur NEGATIVE NEGATIVE mg/dL   Nitrite NEGATIVE NEGATIVE   Leukocytes,Ua NEGATIVE NEGATIVE  Wet prep, genital     Status: Abnormal   Collection Time: 12/19/19  6:22 PM   Specimen: Cervix  Result Value Ref Range   Yeast Wet Prep HPF POC NONE SEEN NONE SEEN   Trich, Wet Prep NONE SEEN NONE SEEN   Clue Cells Wet Prep HPF POC PRESENT (A) NONE SEEN   WBC, Wet Prep HPF POC MANY (A) NONE SEEN   Sperm NONE SEEN   hCG, quantitative, pregnancy     Status: Abnormal   Collection Time: 12/19/19  6:31 PM  Result Value Ref Range   hCG, Beta Chain, Quant, S 5,383 (H) <5 mIU/mL    IMAGING US OB Transvaginal  Result Date: 12/19/2019 CLINICAL DATA:  Vaginal bleeding, abdominal cramping EXAM: TRANSVAGINAL OB ULTRASOUND TECHNIQUE: Transvaginal ultrasound was performed for complete evaluation of the gestation as well as the maternal uterus, adnexal regions, and pelvic cul-de-sac. COMPARISON:  12/10/2019 FINDINGS: Intrauterine gestational sac: Single Yolk sac:  Visualized. Embryo:  Not Visualized. Cardiac Activity: Not Visualized. MSD: 8.1 mm   5 w   4 d Subchorionic hemorrhage:  None visualized. Maternal  uterus/adnexae: Left ovary measures 3.8 x 2.6 x 2.6 cm in the right ovary measures 2.0 x 4.8 x 2.3 cm. Trace pelvic free fluid. Intramural uterine fibroid identified measuring 3.3 x 3.0 x 2.8 cm. IMPRESSION: 1. Probable early intrauterine gestational sac, with newly visualized yolk sac on today's exam. No embryo or cardiac activity yet visualized. Recommend follow-up quantitative B-HCG levels and follow-up US in 14 days to assess viability. This recommendation follows SRU consensus guidelines:  Diagnostic Criteria for Nonviable Pregnancy Early in the First Trimester. [employer] Med 2013; 115:7262-03. 2. Stable uterine fibroid. 3. Trace pelvic free fluid. Electronically Signed   By: Randa Ngo M.D.   On: 12/19/2019 20:01    MAU COURSE Orders Placed This Encounter  Procedures  . Wet prep, genital  . US OB Transvaginal  . Urinalysis, Routine w reflex microscopic Urine, Clean Catch  . hCG, quantitative, pregnancy  . Discharge patient   Meds ordered this encounter  Medications  . Prenatal Vit-Fe Fumarate-FA (PREPLUS) 27-1 MG TABS    Sig: Take 1 tablet by mouth daily.    Dispense:  30 tablet    Refill:  13    Order Specific Question:   Supervising Provider    Answer:   Donnamae Jude [[id number]]  . metroNIDAZOLE (FLAGYL) 500 MG tablet    Sig: Take 1 tablet (500 mg total) by mouth 2 (two) times daily.    Dispense:  14 tablet    Refill:  0    Order Specific Question:   Supervising Provider    Answer:   Merrily Pew    MDM Repeat HCG with appropriate rise U/S showed developing pregnancy with yolk sac visualized Wet prep + for BV  Pt informed of results and encouraged her to set up routine OB care  ASSESSMENT 1. Bacterial vaginosis   2. Vaginal bleeding in pregnancy, first trimester   3. [redacted] weeks gestation of pregnancy   4. Vaginal spotting    PLAN Discharge home in stable condition.    Follow-up [place] [facility].  Schedule an appointment as soon as possible for a visit.   Specialty: Obstetrics and Gynecology Why: for ongoing prenatal care Contact information: [address] [place] [phone]             Allergies as of 12/19/2019      Reactions   Other Other (See Comments)   Dark chocolate: throat swells      Medication List    TAKE these medications   metroNIDAZOLE 500 MG tablet Commonly known as: FLAGYL Take 1 tablet (500 mg total) by mouth 2 (two) times daily.   PrePLUS 27-1 MG Tabs Take 1 tablet by mouth daily.       Gaylan Gerold, CNM, MSN, [facility] 12/19/19 8:42 PM

## 2019-12-19 NOTE — MAU Note (Signed)
.   Kimberly Ponce is a 24 y.o. at [redacted]w[redacted]d here in MAU reporting: she was evaluated in MAU a couple of days ago and was told to come back in if her vaginal bleeding changed in color and she noticed the color was bright red today. Lower abdominal cramping. LMP: 11/02/19 Onset of complaint: ongoing Pain score: 4 Vitals:   12/19/19 1737  BP: 126/69  Pulse: 76  Resp: 16  Temp: 98 F (36.7 C)  SpO2: 99%     FHT: Lab orders placed from triage: UA

## 2019-12-25 ENCOUNTER — Other Ambulatory Visit: Payer: Self-pay

## 2019-12-25 ENCOUNTER — Ambulatory Visit
Admission: RE | Admit: 2019-12-25 | Discharge: 2019-12-25 | Disposition: A | Discharge status: Home / Self Care | Payer: Medicaid Other | Source: Ambulatory Visit | Attending: Obstetrics and Gynecology | Admitting: Obstetrics and Gynecology

## 2019-12-25 ENCOUNTER — Encounter: Payer: Self-pay | Admitting: Family Medicine

## 2019-12-25 ENCOUNTER — Ambulatory Visit (INDEPENDENT_AMBULATORY_CARE_PROVIDER_SITE_OTHER): Payer: Self-pay | Admitting: General Practice

## 2019-12-25 DIAGNOSIS — O3680X0 Pregnancy with inconclusive fetal viability, not applicable or unspecified: Secondary | ICD-10-CM | POA: Diagnosis present

## 2019-12-25 DIAGNOSIS — Z712 Person consulting for explanation of examination or test findings: Secondary | ICD-10-CM

## 2019-12-25 NOTE — Progress Notes (Signed)
Patient presents to office today for viability ultrasound results. Reviewed results with Dr Harolyn Rutherford who finds single living IUP- patient should begin OB care.   Informed patient of results & reviewed dating. Patient was previously given pictures. Patient is not taking any meds yet or vitamins- she states she will pick up previous prescriptions today. Patient will return to office of choice to begin prenatal care about 10-12 weeks.  Koren Bound RN BSN 12/25/19

## 2019-12-25 NOTE — Patient Instructions (Addendum)
Prenatal Care Providers           [facility]  [address] [phone]  [facility]   [address]  [phone]  [facility] [place]       [address] [phone]            [facility] [place]     [address] [phone]          [facility] [place]   [address] [phone]  [facility]  [address] [phone]     [facility]  [address]   [phone]     [facility]  Phone: [phone]  [facility]  Phone: [phone]  [facility] Phone: [phone]  [facility] Phone: [phone]  [facility] Phone: [phone]  [facility] Phone: [phone]  [facility]  Phone: [phone]  

## 2019-12-25 NOTE — Progress Notes (Signed)
Patient was assessed and managed by nursing staff during this encounter. I have reviewed the chart and agree with the documentation and plan. I have also made any necessary editorial changes.  Verita Schneiders, MD 12/25/2019 2:11 PM

## 2020-01-03 ENCOUNTER — Other Ambulatory Visit: Payer: Self-pay

## 2020-01-03 ENCOUNTER — Inpatient Hospital Stay (HOSPITAL_COMMUNITY)
Admission: AD | Admit: 2020-01-03 | Discharge: 2020-01-03 | Disposition: A | Discharge status: Home / Self Care | Payer: Medicaid Other | Attending: Obstetrics and Gynecology | Admitting: Obstetrics and Gynecology

## 2020-01-03 DIAGNOSIS — Z3491 Encounter for supervision of normal pregnancy, unspecified, first trimester: Secondary | ICD-10-CM

## 2020-01-03 DIAGNOSIS — N930 Postcoital and contact bleeding: Secondary | ICD-10-CM | POA: Insufficient documentation

## 2020-01-03 DIAGNOSIS — O99891 Other specified diseases and conditions complicating pregnancy: Secondary | ICD-10-CM | POA: Diagnosis not present

## 2020-01-03 DIAGNOSIS — O26851 Spotting complicating pregnancy, first trimester: Secondary | ICD-10-CM | POA: Insufficient documentation

## 2020-01-03 DIAGNOSIS — R109 Unspecified abdominal pain: Secondary | ICD-10-CM | POA: Insufficient documentation

## 2020-01-03 DIAGNOSIS — Z3A08 8 weeks gestation of pregnancy: Secondary | ICD-10-CM | POA: Insufficient documentation

## 2020-01-03 DIAGNOSIS — Z87891 Personal history of nicotine dependence: Secondary | ICD-10-CM | POA: Diagnosis not present

## 2020-01-03 DIAGNOSIS — O26891 Other specified pregnancy related conditions, first trimester: Secondary | ICD-10-CM | POA: Diagnosis not present

## 2020-01-03 DIAGNOSIS — O209 Hemorrhage in early pregnancy, unspecified: Secondary | ICD-10-CM | POA: Diagnosis present

## 2020-01-03 LAB — CBC
HCT: 36.8 % (ref 36.0–46.0)
Hemoglobin: 12.2 g/dL (ref 12.0–15.0)
MCH: 30.8 pg (ref 26.0–34.0)
MCHC: 33.2 g/dL (ref 30.0–36.0)
MCV: 92.9 fL (ref 80.0–100.0)
Platelets: 359 10*3/uL (ref 150–400)
RBC: 3.96 MIL/uL (ref 3.87–5.11)
RDW: 12.7 % (ref 11.5–15.5)
WBC: 7.5 10*3/uL (ref 4.0–10.5)
nRBC: 0 % (ref 0.0–0.2)

## 2020-01-03 NOTE — Discharge Instructions (Signed)

## 2020-01-03 NOTE — MAU Note (Signed)
Been here before for brownish spotting.  Today had some bright bleeding and a couple of clots in it. Noted when she wiped, has picture, one very small clot. Dull ache in lower abd.   Had intercourse prior to bleeding.

## 2020-01-03 NOTE — MAU Note (Signed)
+  FH noted on bedside sono by CNM

## 2020-01-03 NOTE — MAU Provider Note (Signed)
History     CSN: [id number]  Arrival date and time: 01/03/20 1625   First Provider Initiated Contact with Patient 01/03/20 1729      Chief Complaint  Patient presents with   Vaginal Bleeding   Abdominal Pain   HPI Kimberly Ponce is a 24 y.o. G1P0 at [redacted]w[redacted]d with chief complaint of postcoital spotting. This is a recurrent problem, current episode began today. Patient has previously been evaluated for "brownish" spotting. Patient did not don a pad and denies heavy bleeding. She endorses mild abdominal cramping for which she has not taken medication or tried other treatments.  She denies abdominal tenderness, dysuria, fever or recent illness.   OB History    Gravida  1   Para      Term      Preterm      AB      Living        SAB      TAB      Ectopic      Multiple      Live Births              Past Medical History:  Diagnosis Date   Gonorrhea 2019   Trichimoniasis 2019    Past Surgical History:  Procedure Laterality Date   COSMETIC SURGERY     brazilian butt lift   FINGER TENDON REPAIR      Family History  Problem Relation Age of Onset   Cancer Father    Hypertension Father    Cancer Other    Diabetes Other     Social History   Tobacco Use   Smoking status: Former Smoker    Types: Cigarettes    Quit date: 11/29/2019    Years since quitting: 0.0   Smokeless tobacco: Never Used  Vaping Use   Vaping Use: Former   Quit date: 11/29/2019  Substance Use Topics   Alcohol use: Not Currently    Comment: occ   Drug use: No    Allergies:  Allergies  Allergen Reactions   Other Other (See Comments)    Dark chocolate: throat swells    No medications prior to admission.    Review of Systems  Gastrointestinal: Negative for abdominal pain.  Genitourinary: Positive for vaginal bleeding.  Musculoskeletal: Negative for back pain.  All other systems reviewed and are negative.  Physical Exam   Blood pressure 116/72, pulse  85, temperature 98.9 F (37.2 C), temperature source Oral, resp. rate 16, height 5\' 2"  (1.575 m), weight 92 kg, last menstrual period 11/02/2019, SpO2 98 %.  Physical Exam Vitals and nursing note reviewed. Exam conducted with a chaperone present.  Constitutional:      Appearance: She is well-developed. She is obese.  Cardiovascular:     Rate and Rhythm: Normal rate.  Pulmonary:     Effort: Pulmonary effort is normal.  Abdominal:     General: Abdomen is flat.     Tenderness: There is no abdominal tenderness.  Genitourinary:    Comments: Deferred due to report of scant spotting Skin:    General: Skin is warm and dry.     Capillary Refill: Capillary refill takes less than 2 seconds.  Neurological:     General: No focal deficit present.     Mental Status: She is alert.  Psychiatric:        Mood and Affect: Mood normal.        Behavior: Behavior normal.     MAU Course  Procedures  Bedside Ultrasound for FHR check: Patient informed that the ultrasound is considered a limited obstetric ultrasound and is not intended to be a complete ultrasound exam.  Patient also informed that the ultrasound is not being completed with the intent of assessing for fetal or placental anomalies or any pelvic abnormalities.  Explained that the purpose of todays ultrasound is to assess for fetal heart rate.  Patient acknowledges the purpose of the exam and the limitations of the study.    Patient Vitals for the past 24 hrs:  BP Temp Temp src Pulse Resp SpO2 Height Weight  01/03/20 1745 116/72 -- -- 85 -- -- -- --  01/03/20 1655 119/71 98.9 F (37.2 C) Oral 88 16 98 % 5\' 2"  (1.575 m) 92 kg   Results for orders placed or performed during the hospital encounter of 01/03/20 (from the past 24 hour(s))  CBC     Status: None   Collection Time: 01/03/20  5:34 PM  Result Value Ref Range   WBC 7.5 4.0 - 10.5 K/uL   RBC 3.96 3.87 - 5.11 MIL/uL   Hemoglobin 12.2 12.0 - 15.0 g/dL   HCT 36.8 36 - 46 %   MCV  92.9 80.0 - 100.0 fL   MCH 30.8 26.0 - 34.0 pg   MCHC 33.2 30.0 - 36.0 g/dL   RDW 12.7 11.5 - 15.5 %   Platelets 359 150 - 400 K/uL   nRBC 0.0 0.0 - 0.2 %    Assessment and Plan  --24 y.o. G1P0 at [redacted]w[redacted]d  --FHT 145 BSUS --Blood type O POS --Postcoital spotting, advised pelvic rest as intercourse is consistent trigger  --Discharge home in stable condition  Darlina Rumpf, CNM 01/03/2020, 7:02 PM

## 2020-01-29 DIAGNOSIS — Z419 Encounter for procedure for purposes other than remedying health state, unspecified: Secondary | ICD-10-CM | POA: Diagnosis not present

## 2020-01-31 ENCOUNTER — Encounter: Payer: Medicaid Other | Admitting: Advanced Practice Midwife

## 2020-02-01 DIAGNOSIS — Z3A11 11 weeks gestation of pregnancy: Secondary | ICD-10-CM | POA: Diagnosis not present

## 2020-02-01 DIAGNOSIS — O3680X9 Pregnancy with inconclusive fetal viability, other fetus: Secondary | ICD-10-CM | POA: Diagnosis not present

## 2020-02-01 DIAGNOSIS — Z113 Encounter for screening for infections with a predominantly sexual mode of transmission: Secondary | ICD-10-CM | POA: Diagnosis not present

## 2020-02-01 DIAGNOSIS — Z369 Encounter for antenatal screening, unspecified: Secondary | ICD-10-CM | POA: Diagnosis not present

## 2020-02-01 DIAGNOSIS — Z124 Encounter for screening for malignant neoplasm of cervix: Secondary | ICD-10-CM | POA: Diagnosis not present

## 2020-02-01 LAB — OB RESULTS CONSOLE HEPATITIS B SURFACE ANTIGEN: Hepatitis B Surface Ag: NEGATIVE

## 2020-02-01 LAB — OB RESULTS CONSOLE HIV ANTIBODY (ROUTINE TESTING): HIV: NONREACTIVE

## 2020-02-01 LAB — OB RESULTS CONSOLE GC/CHLAMYDIA: Gonorrhea: NEGATIVE

## 2020-02-01 LAB — OB RESULTS CONSOLE RPR: RPR: NONREACTIVE

## 2020-02-01 LAB — OB RESULTS CONSOLE RUBELLA ANTIBODY, IGM: Rubella: IMMUNE

## 2020-02-28 DIAGNOSIS — Z419 Encounter for procedure for purposes other than remedying health state, unspecified: Secondary | ICD-10-CM | POA: Diagnosis not present

## 2020-03-30 DIAGNOSIS — Z419 Encounter for procedure for purposes other than remedying health state, unspecified: Secondary | ICD-10-CM | POA: Diagnosis not present

## 2020-04-18 DIAGNOSIS — O99212 Obesity complicating pregnancy, second trimester: Secondary | ICD-10-CM | POA: Diagnosis not present

## 2020-04-18 DIAGNOSIS — Z363 Encounter for antenatal screening for malformations: Secondary | ICD-10-CM | POA: Diagnosis not present

## 2020-04-18 DIAGNOSIS — Z3A22 22 weeks gestation of pregnancy: Secondary | ICD-10-CM | POA: Diagnosis not present

## 2020-04-30 DIAGNOSIS — Z419 Encounter for procedure for purposes other than remedying health state, unspecified: Secondary | ICD-10-CM | POA: Diagnosis not present

## 2020-05-14 DIAGNOSIS — Z3A26 26 weeks gestation of pregnancy: Secondary | ICD-10-CM | POA: Diagnosis not present

## 2020-05-14 DIAGNOSIS — Z362 Encounter for other antenatal screening follow-up: Secondary | ICD-10-CM | POA: Diagnosis not present

## 2020-05-28 DIAGNOSIS — Z419 Encounter for procedure for purposes other than remedying health state, unspecified: Secondary | ICD-10-CM | POA: Diagnosis not present

## 2020-06-12 DIAGNOSIS — D259 Leiomyoma of uterus, unspecified: Secondary | ICD-10-CM | POA: Diagnosis not present

## 2020-06-12 DIAGNOSIS — Z331 Pregnant state, incidental: Secondary | ICD-10-CM | POA: Diagnosis not present

## 2020-06-12 DIAGNOSIS — R102 Pelvic and perineal pain: Secondary | ICD-10-CM | POA: Diagnosis not present

## 2020-06-28 DIAGNOSIS — Z419 Encounter for procedure for purposes other than remedying health state, unspecified: Secondary | ICD-10-CM | POA: Diagnosis not present

## 2020-06-28 DIAGNOSIS — Z20828 Contact with and (suspected) exposure to other viral communicable diseases: Secondary | ICD-10-CM | POA: Diagnosis not present

## 2020-07-22 ENCOUNTER — Other Ambulatory Visit: Payer: Self-pay

## 2020-07-22 ENCOUNTER — Inpatient Hospital Stay (HOSPITAL_COMMUNITY)
Admission: AD | Admit: 2020-07-22 | Discharge: 2020-07-28 | DRG: 788 | Disposition: A | Discharge status: Home / Self Care | Payer: Medicaid Other | Attending: Obstetrics & Gynecology | Admitting: Obstetrics & Gynecology

## 2020-07-22 ENCOUNTER — Encounter (HOSPITAL_COMMUNITY): Payer: Self-pay | Admitting: Obstetrics and Gynecology

## 2020-07-22 DIAGNOSIS — Z3A36 36 weeks gestation of pregnancy: Secondary | ICD-10-CM

## 2020-07-22 DIAGNOSIS — Z3689 Encounter for other specified antenatal screening: Secondary | ICD-10-CM

## 2020-07-22 DIAGNOSIS — O1410 Severe pre-eclampsia, unspecified trimester: Secondary | ICD-10-CM | POA: Diagnosis present

## 2020-07-22 DIAGNOSIS — Z3A37 37 weeks gestation of pregnancy: Secondary | ICD-10-CM

## 2020-07-22 DIAGNOSIS — Z20822 Contact with and (suspected) exposure to covid-19: Secondary | ICD-10-CM | POA: Diagnosis not present

## 2020-07-22 DIAGNOSIS — Z87891 Personal history of nicotine dependence: Secondary | ICD-10-CM | POA: Diagnosis not present

## 2020-07-22 DIAGNOSIS — Z3490 Encounter for supervision of normal pregnancy, unspecified, unspecified trimester: Secondary | ICD-10-CM | POA: Diagnosis not present

## 2020-07-22 DIAGNOSIS — R2990 Unspecified symptoms and signs involving the nervous system: Secondary | ICD-10-CM | POA: Diagnosis present

## 2020-07-22 DIAGNOSIS — O139 Gestational [pregnancy-induced] hypertension without significant proteinuria, unspecified trimester: Secondary | ICD-10-CM | POA: Diagnosis present

## 2020-07-22 DIAGNOSIS — Z419 Encounter for procedure for purposes other than remedying health state, unspecified: Secondary | ICD-10-CM | POA: Diagnosis not present

## 2020-07-22 DIAGNOSIS — O133 Gestational [pregnancy-induced] hypertension without significant proteinuria, third trimester: Secondary | ICD-10-CM

## 2020-07-22 DIAGNOSIS — O134 Gestational [pregnancy-induced] hypertension without significant proteinuria, complicating childbirth: Principal | ICD-10-CM | POA: Diagnosis present

## 2020-07-22 DIAGNOSIS — Z98891 History of uterine scar from previous surgery: Secondary | ICD-10-CM

## 2020-07-22 LAB — URINALYSIS, ROUTINE W REFLEX MICROSCOPIC
Bilirubin Urine: NEGATIVE
Glucose, UA: NEGATIVE mg/dL
Hgb urine dipstick: NEGATIVE
Ketones, ur: 5 mg/dL — AB
Leukocytes,Ua: NEGATIVE
Nitrite: NEGATIVE
Protein, ur: 30 mg/dL — AB
Specific Gravity, Urine: 1.033 — ABNORMAL HIGH (ref 1.005–1.030)
pH: 6 (ref 5.0–8.0)

## 2020-07-22 LAB — COMPREHENSIVE METABOLIC PANEL
ALT: 14 U/L (ref 0–44)
AST: 17 U/L (ref 15–41)
Albumin: 2.8 g/dL — ABNORMAL LOW (ref 3.5–5.0)
Alkaline Phosphatase: 87 U/L (ref 38–126)
Anion gap: 8 (ref 5–15)
BUN: 8 mg/dL (ref 6–20)
CO2: 21 mmol/L — ABNORMAL LOW (ref 22–32)
Calcium: 8.9 mg/dL (ref 8.9–10.3)
Chloride: 105 mmol/L (ref 98–111)
Creatinine, Ser: 0.65 mg/dL (ref 0.44–1.00)
GFR, Estimated: >60 mL/min (ref >60)
Glucose, Bld: 87 mg/dL (ref 70–99)
Potassium: 3.7 mmol/L (ref 3.5–5.1)
Sodium: 134 mmol/L — ABNORMAL LOW (ref 135–145)
Total Bilirubin: 0.5 mg/dL (ref 0.3–1.2)
Total Protein: 5.8 g/dL — ABNORMAL LOW (ref 6.5–8.1)

## 2020-07-22 LAB — CBC
HCT: 29.1 % — ABNORMAL LOW (ref 36.0–46.0)
Hemoglobin: 9.7 g/dL — ABNORMAL LOW (ref 12.0–15.0)
MCH: 30.5 pg (ref 26.0–34.0)
MCHC: 33.3 g/dL (ref 30.0–36.0)
MCV: 91.5 fL (ref 80.0–100.0)
Platelets: 234 10*3/uL (ref 150–400)
RBC: 3.18 MIL/uL — ABNORMAL LOW (ref 3.87–5.11)
RDW: 13.3 % (ref 11.5–15.5)
WBC: 6 10*3/uL (ref 4.0–10.5)
nRBC: 0 % (ref 0.0–0.2)

## 2020-07-22 LAB — PROTEIN / CREATININE RATIO, URINE
Creatinine, Urine: 322.58 mg/dL
Protein Creatinine Ratio: 0.06 mg/mg{Cre} (ref 0.00–0.15)
Total Protein, Urine: 20 mg/dL

## 2020-07-22 MED ORDER — OXYTOCIN-SODIUM CHLORIDE 30-0.9 UT/500ML-% IV SOLN
INTRAVENOUS | Status: AC
  Administered 2020-07-23: 1 m[IU]/min via INTRAVENOUS
  Filled 2020-07-22: qty 500

## 2020-07-22 NOTE — MAU Note (Signed)
..  Kimberly Ponce is a 25 y.o. at 105w4d here in MAU reporting: swelling and numbness in her hands and feet that began yesterday. She has had this before but this is the first time it has not gone down. Reports she took her blood pressure today and it was 147/92. Denies visual changes, epigastric pain, or headache. Denies contractions, LOF or vaginal bleeding. +FM Pain score: 0/10 Vitals:   07/22/20 1919  BP: (!) 135/92  Pulse: 81  Resp: 18  Temp: 97.8 F (36.6 C)  SpO2: 100%     FHT: 135 Lab orders placed from triage: UA

## 2020-07-22 NOTE — MAU Provider Note (Signed)
History     CSN: [id number]  Arrival date and time: 07/22/20 1859   Event Date/Time   First Provider Initiated Contact with Patient 07/22/20 2049      Chief Complaint  Patient presents with  . Leg Swelling  . Hand Swelling   Ms. Kimberly Ponce is a 25 y.o. G1P0 at [redacted]w[redacted]d who presents to MAU for preeclampsia evaluation after she woke up this morning with swelling in her hands and her feet that did not resolve over the course of the day. Patient reports it is normal for her to get swollen feet throughout the day that resolves by morning, but never swollen hands. Patient reports her hands were so swollen that she had to take off her rings and her bracelets.  Pt denies HA, blurry vision/seeing spots, N/V, epigastric pain, swelling in face, sudden weight gain. Pt denies chest pain and SOB.  Pt denies constipation, diarrhea, or urinary problems. Pt denies fever, chills, fatigue, sweating or changes in appetite. Pt denies dizziness, light-headedness, weakness.  Pt denies VB, ctx, LOF and reports good FM.   OB History    Gravida  1   Para      Term      Preterm      AB      Living        SAB      IAB      Ectopic      Multiple      Live Births              Past Medical History:  Diagnosis Date  . Gonorrhea 2019  . Trichimoniasis 2019    Past Surgical History:  Procedure Laterality Date  . COSMETIC SURGERY     brazilian butt lift  . FINGER TENDON REPAIR      Family History  Problem Relation Age of Onset  . Cancer Father   . Hypertension Father   . Cancer Other   . Diabetes Other     Social History   Tobacco Use  . Smoking status: Former Smoker    Types: Cigarettes    Quit date: 11/29/2019    Years since quitting: 0.6  . Smokeless tobacco: Never Used  Vaping Use  . Vaping Use: Former  . Quit date: 11/29/2019  Substance Use Topics  . Alcohol use: Not Currently    Comment: occ  . Drug use: No    Allergies:  Allergies  Allergen  Reactions  . Other Other (See Comments)    Dark chocolate: throat swells    Medications Prior to Admission  Medication Sig Dispense Refill Last Dose  . Prenatal Vit-Fe Fumarate-FA (PREPLUS) 27-1 MG TABS Take 1 tablet by mouth daily. 30 tablet 13 07/22/2020 at Unknown time    Review of Systems  Constitutional: Negative for chills, diaphoresis, fatigue and fever.  Eyes: Negative for visual disturbance.  Respiratory: Negative for shortness of breath.   Cardiovascular: Negative for chest pain.  Gastrointestinal: Negative for abdominal pain, constipation, diarrhea, nausea and vomiting.  Genitourinary: Negative for dysuria, flank pain, frequency, pelvic pain, urgency, vaginal bleeding and vaginal discharge.  Musculoskeletal:       Swelling in hands and feet.  Neurological: Negative for dizziness, weakness, light-headedness and headaches.   Physical Exam   Blood pressure 132/88, pulse 72, temperature 97.8 F (36.6 C), temperature source Oral, resp. rate 18, height 5\' 2"  (1.575 m), weight 103.8 kg, last menstrual period 11/02/2019, SpO2 100 %.  Patient Vitals for the past  24 hrs:  BP Temp Temp src Pulse Resp SpO2 Height Weight  07/22/20 2130 132/88 -- -- 72 -- -- -- --  07/22/20 2101 (!) 133/99 -- -- 75 -- -- -- --  07/22/20 2051 (!) 139/95 -- -- 67 -- -- -- --  07/22/20 2046 (!) 142/92 -- -- 74 -- -- -- --  07/22/20 2031 133/85 -- -- 62 -- -- -- --  07/22/20 2016 137/90 -- -- 61 -- -- -- --  07/22/20 2000 (!) 137/97 -- -- 67 -- -- -- --  07/22/20 1946 -- -- -- 73 -- -- -- --  07/22/20 1945 (!) 139/93 -- -- -- -- -- -- --  07/22/20 1919 (!) 135/92 97.8 F (36.6 C) Oral 81 18 100 % 5\' 2"  (1.575 m) 103.8 kg   Physical Exam Vitals and nursing note reviewed.  Constitutional:      General: She is not in acute distress.    Appearance: Normal appearance. She is not ill-appearing, toxic-appearing or diaphoretic.  HENT:     Head: Normocephalic and atraumatic.  Pulmonary:     Effort:  Pulmonary effort is normal.  Skin:    General: Skin is warm and dry.     Comments: Mild, non-pitting edema present in hands and feet bilaterally, no evidence of DVT.  Neurological:     Mental Status: She is alert and oriented to person, place, and time.  Psychiatric:        Mood and Affect: Mood normal.        Behavior: Behavior normal.        Thought Content: Thought content normal.        Judgment: Judgment normal.    Results for orders placed or performed during the hospital encounter of 07/22/20 (from the past 24 hour(s))  Urinalysis, Routine w reflex microscopic     Status: Abnormal   Collection Time: 07/22/20  8:05 PM  Result Value Ref Range   Color, Urine AMBER (A) YELLOW   APPearance CLOUDY (A) CLEAR   Specific Gravity, Urine 1.033 (H) 1.005 - 1.030   pH 6.0 5.0 - 8.0   Glucose, UA NEGATIVE NEGATIVE mg/dL   Hgb urine dipstick NEGATIVE NEGATIVE   Bilirubin Urine NEGATIVE NEGATIVE   Ketones, ur 5 (A) NEGATIVE mg/dL   Protein, ur 30 (A) NEGATIVE mg/dL   Nitrite NEGATIVE NEGATIVE   Leukocytes,Ua NEGATIVE NEGATIVE   RBC / HPF 0-5 0 - 5 RBC/hpf   WBC, UA 0-5 0 - 5 WBC/hpf   Bacteria, UA RARE (A) NONE SEEN   Squamous Epithelial / LPF 21-50 0 - 5   Mucus PRESENT   CBC     Status: Abnormal   Collection Time: 07/22/20  8:08 PM  Result Value Ref Range   WBC 6.0 4.0 - 10.5 K/uL   RBC 3.18 (L) 3.87 - 5.11 MIL/uL   Hemoglobin 9.7 (L) 12.0 - 15.0 g/dL   HCT 29.1 (L) 36.0 - 46.0 %   MCV 91.5 80.0 - 100.0 fL   MCH 30.5 26.0 - 34.0 pg   MCHC 33.3 30.0 - 36.0 g/dL   RDW 13.3 11.5 - 15.5 %   Platelets 234 150 - 400 K/uL   nRBC 0.0 0.0 - 0.2 %  Comprehensive metabolic panel     Status: Abnormal   Collection Time: 07/22/20  8:08 PM  Result Value Ref Range   Sodium 134 (L) 135 - 145 mmol/L   Potassium 3.7 3.5 - 5.1 mmol/L   Chloride 105 98 -  111 mmol/L   CO2 21 (L) 22 - 32 mmol/L   Glucose, Bld 87 70 - 99 mg/dL   BUN 8 6 - 20 mg/dL   Creatinine, Ser 0.65 0.44 - 1.00 mg/dL    Calcium 8.9 8.9 - 10.3 mg/dL   Total Protein 5.8 (L) 6.5 - 8.1 g/dL   Albumin 2.8 (L) 3.5 - 5.0 g/dL   AST 17 15 - 41 U/L   ALT 14 0 - 44 U/L   Alkaline Phosphatase 87 38 - 126 U/L   Total Bilirubin 0.5 0.3 - 1.2 mg/dL   GFR, Estimated >60 >60 mL/min   Anion gap 8 5 - 15   No results found.  MAU Course  Procedures  MDM -preeclampsia evaluation without severe range BP in MAU on admission -symptoms include: swelling in hands and feet -UA: amber/cloudy/SG 1.033/5ketones/30PRO/rare -CBC: H/H 9.7/29.1, platelets 234 -CMP: serum creatinine 0.65, AST/ALT 17/14 -PCr: pending at time of admission -EFM: reactive       -baseline: 120       -variability: moderate       -accels: present, 15x15       -decels: absent       -TOCO: few ctx, pt not feeling -consulted with Dr. Rip Harbour, recommends pt be admitted for gHTN -called Dr. Mancel Bale to recommend admission, Dr. Mancel Bale agrees with plan for induction -admit to L&D for induction  Orders Placed This Encounter  Procedures  . Urinalysis, Routine w reflex microscopic Urine, Clean Catch    Standing Status:   Standing    Number of Occurrences:   1  . CBC    Standing Status:   Standing    Number of Occurrences:   1  . Comprehensive metabolic panel    Standing Status:   Standing    Number of Occurrences:   1  . Protein / creatinine ratio, urine    Standing Status:   Standing    Number of Occurrences:   1   No orders of the defined types were placed in this encounter.  Assessment and Plan   1. Gestational hypertension, third trimester   2. [redacted] weeks gestation of pregnancy   3. NST (non-stress test) reactive    -admit to L&D  Gerrie Nordmann Veola Cafaro 07/22/2020, 9:39 PM

## 2020-07-23 ENCOUNTER — Inpatient Hospital Stay (HOSPITAL_COMMUNITY): Payer: Medicaid Other | Admitting: Anesthesiology

## 2020-07-23 ENCOUNTER — Encounter (HOSPITAL_COMMUNITY): Payer: Self-pay | Admitting: Obstetrics and Gynecology

## 2020-07-23 DIAGNOSIS — Z3A36 36 weeks gestation of pregnancy: Secondary | ICD-10-CM | POA: Diagnosis not present

## 2020-07-23 DIAGNOSIS — O139 Gestational [pregnancy-induced] hypertension without significant proteinuria, unspecified trimester: Secondary | ICD-10-CM

## 2020-07-23 DIAGNOSIS — Z20822 Contact with and (suspected) exposure to covid-19: Secondary | ICD-10-CM | POA: Diagnosis not present

## 2020-07-23 DIAGNOSIS — Z87891 Personal history of nicotine dependence: Secondary | ICD-10-CM | POA: Diagnosis not present

## 2020-07-23 DIAGNOSIS — O134 Gestational [pregnancy-induced] hypertension without significant proteinuria, complicating childbirth: Secondary | ICD-10-CM | POA: Diagnosis not present

## 2020-07-23 HISTORY — DX: Gestational (pregnancy-induced) hypertension without significant proteinuria, unspecified trimester: O13.9

## 2020-07-23 LAB — CBC
HCT: 31.8 % — ABNORMAL LOW (ref 36.0–46.0)
Hemoglobin: 10.5 g/dL — ABNORMAL LOW (ref 12.0–15.0)
MCH: 30.2 pg (ref 26.0–34.0)
MCHC: 33 g/dL (ref 30.0–36.0)
MCV: 91.4 fL (ref 80.0–100.0)
Platelets: 256 10*3/uL (ref 150–400)
RBC: 3.48 MIL/uL — ABNORMAL LOW (ref 3.87–5.11)
RDW: 13.3 % (ref 11.5–15.5)
WBC: 8.6 10*3/uL (ref 4.0–10.5)
nRBC: 0 % (ref 0.0–0.2)

## 2020-07-23 LAB — TYPE AND SCREEN
ABO/RH(D): O POS
Antibody Screen: NEGATIVE

## 2020-07-23 LAB — SARS CORONAVIRUS 2 (TAT 6-24 HRS): SARS Coronavirus 2: NEGATIVE

## 2020-07-23 LAB — GROUP B STREP BY PCR: Group B strep by PCR: NEGATIVE

## 2020-07-23 MED ORDER — FENTANYL CITRATE (PF) 100 MCG/2ML IJ SOLN
50.0000 ug | INTRAMUSCULAR | Status: DC | PRN
  Administered 2020-07-23: 50 ug via INTRAVENOUS
  Filled 2020-07-23: qty 2

## 2020-07-23 MED ORDER — ONDANSETRON HCL 4 MG/2ML IJ SOLN: 4.0000 mg | Freq: Four times a day (QID) | INTRAMUSCULAR | Status: DC | PRN

## 2020-07-23 MED ORDER — OXYTOCIN-SODIUM CHLORIDE 30-0.9 UT/500ML-% IV SOLN: 2.5000 [IU]/h | INTRAVENOUS | Status: DC

## 2020-07-23 MED ORDER — BETAMETHASONE SOD PHOS & ACET 6 (3-3) MG/ML IJ SUSP
12.0000 mg | Freq: Two times a day (BID) | INTRAMUSCULAR | Status: AC
  Administered 2020-07-23 (×2): 12 mg via INTRAMUSCULAR
  Filled 2020-07-23: qty 5

## 2020-07-23 MED ORDER — EPHEDRINE 5 MG/ML INJ: 10.0000 mg | INTRAVENOUS | Status: DC | PRN

## 2020-07-23 MED ORDER — FENTANYL-BUPIVACAINE-NACL 0.5-0.125-0.9 MG/250ML-% EP SOLN
EPIDURAL | Status: DC | PRN
  Administered 2020-07-23: 12 mL/h via EPIDURAL

## 2020-07-23 MED ORDER — OXYTOCIN BOLUS FROM INFUSION: 333.0000 mL | Freq: Once | INTRAVENOUS | Status: DC

## 2020-07-23 MED ORDER — TERBUTALINE SULFATE 1 MG/ML IJ SOLN: 0.2500 mg | Freq: Once | INTRAMUSCULAR | Status: DC | PRN

## 2020-07-23 MED ORDER — LIDOCAINE HCL (PF) 1 % IJ SOLN
INTRAMUSCULAR | Status: DC | PRN
  Administered 2020-07-23: 5 mL via EPIDURAL

## 2020-07-23 MED ORDER — ACETAMINOPHEN 325 MG PO TABS: 650.0000 mg | ORAL_TABLET | ORAL | Status: DC | PRN

## 2020-07-23 MED ORDER — MISOPROSTOL 50MCG HALF TABLET
50.0000 ug | ORAL_TABLET | ORAL | Status: DC
  Administered 2020-07-23: 50 ug via ORAL
  Filled 2020-07-23: qty 1

## 2020-07-23 MED ORDER — DIPHENHYDRAMINE HCL 50 MG/ML IJ SOLN
12.5000 mg | INTRAMUSCULAR | Status: DC | PRN
Start: 2020-07-23 — End: 2020-07-25

## 2020-07-23 MED ORDER — LACTATED RINGERS IV SOLN: INTRAVENOUS | Status: DC

## 2020-07-23 MED ORDER — MISOPROSTOL 25 MCG QUARTER TABLET
25.0000 ug | ORAL_TABLET | ORAL | Status: DC
  Administered 2020-07-23 (×2): 25 ug via VAGINAL
  Filled 2020-07-23 (×2): qty 1

## 2020-07-23 MED ORDER — LACTATED RINGERS IV SOLN: 500.0000 mL | INTRAVENOUS | Status: DC | PRN

## 2020-07-23 MED ORDER — LIDOCAINE HCL (PF) 1 % IJ SOLN: 30.0000 mL | INTRAMUSCULAR | Status: DC | PRN

## 2020-07-23 MED ORDER — PHENYLEPHRINE 40 MCG/ML (10ML) SYRINGE FOR IV PUSH (FOR BLOOD PRESSURE SUPPORT): 80.0000 ug | PREFILLED_SYRINGE | INTRAVENOUS | Status: DC | PRN

## 2020-07-23 MED ORDER — SOD CITRATE-CITRIC ACID 500-334 MG/5ML PO SOLN
30.0000 mL | ORAL | Status: DC | PRN
  Administered 2020-07-24: 30 mL via ORAL
  Filled 2020-07-23 (×2): qty 15

## 2020-07-23 MED ORDER — OXYTOCIN-SODIUM CHLORIDE 30-0.9 UT/500ML-% IV SOLN
1.0000 m[IU]/min | INTRAVENOUS | Status: DC
Start: 2020-07-23 — End: 2020-07-25

## 2020-07-23 MED ORDER — LACTATED RINGERS IV SOLN: 500.0000 mL | Freq: Once | INTRAVENOUS | Status: DC

## 2020-07-23 MED ORDER — FENTANYL-BUPIVACAINE-NACL 0.5-0.125-0.9 MG/250ML-% EP SOLN
12.0000 mL/h | EPIDURAL | Status: DC | PRN
  Administered 2020-07-24: 12 mL/h via EPIDURAL
  Filled 2020-07-23 (×2): qty 250

## 2020-07-23 NOTE — Progress Notes (Signed)
Pt was admitted for induction for gestational HTN  With some severe range pressures and was thought to be 38 weeks.  She continues to have elevated pressures so we proceeded with the induction.   R&B of all things considered.   Cat 1 Monitor closely Anticipate SVD

## 2020-07-23 NOTE — H&P (Addendum)
OB ADMISSION/ HISTORY & PHYSICAL:  Admission Date: 07/22/2020  6:59 PM  Admit Diagnosis: Gestational hypertension w/ severe range BP and neuro symptoms  Kimberly Ponce is a 25 y.o. female G1P0 [redacted]w[redacted]d presenting for BP eval. Pt reports swelling of hands and feet while at work. She had her BP taken at work and it was found to be "160's/90's". Pt also had a mild HA, mild RUQ pain, and no visual changes. Pt called the office and was advised to be seen. Pt states she was not allowed to leave work and stayed until the end of her shift. Severe range BP noted in MAU. HA was resolved when pt arrived to MAU. Reports worsening of swelling in hands and feet. Endorses active FM, denies LOF and vaginal bleeding.   EDC corrected in [place]. EDC 08/20/20 by [redacted]w[redacted]d ultrasound. Pt reports irreg menses and uncertain of LMP.   History of current pregnancy: G1P0   Patient entered care with [facility] at 11+2 wks.   EDC 08/20/20 by [place] @ 5+6 wks   Anatomy scan:  22 wks, incomplete w/ posterior placenta. F/U anat scan @ 26 wks complete  Antenatal testing: N/A Last evaluation: 26 wks vertex, posterior placenta, EFW 2lb3oz (75th %tile) Significant prenatal events:  Patient Active Problem List   Diagnosis Date Noted  . Gestational hypertension 07/23/2020    Prenatal Labs: ABO, Rh: --/--/O POS (09/12 1841) Antibody: NEG (04/25 2150) Rubella:   immune RPR:   NR HBsAg:   NR HIV:   NR GTT: passed 1 hr GBS:   unknown GC/CHL: neg/neg Genetics: low-risk female, Horizon neg Tdap/influenza vaccines: declined both   OB History  Gravida Para Term Preterm AB Living  1            SAB IAB Ectopic Multiple Live Births               # Outcome Date GA Lbr Len/2nd Weight Sex Delivery Anes PTL Lv  1 Current             Medical / Surgical History: Past medical history:  Past Medical History:  Diagnosis Date  . Gonorrhea 2019  . Trichimoniasis 2019    Past surgical history:  Past Surgical History:  Procedure Laterality  Date  . COSMETIC SURGERY     brazilian butt lift  . FINGER TENDON REPAIR     Family History:  Family History  Problem Relation Age of Onset  . Cancer Father   . Hypertension Father   . Cancer Other   . Diabetes Other     Social History:  reports that she quit smoking about 7 months ago. Her smoking use included cigarettes. She has never used smokeless tobacco. She reports previous alcohol use. She reports that she does not use drugs.  Allergies: Other   Current Medications at time of admission:  Prior to Admission medications   Medication Sig Start Date End Date Taking? Authorizing Provider  Prenatal Vit-Fe Fumarate-FA (PREPLUS) 27-1 MG TABS Take 1 tablet by mouth daily. 12/19/19  Yes Gabriel Carina, CNM    Review of Systems: Constitutional: Negative   HENT: Negative   Eyes: Negative   Respiratory: Negative   Cardiovascular: Negative   Gastrointestinal: Negative  Genitourinary: neg for bloody show, neg for LOF   Musculoskeletal: Negative   Skin: Negative   Neurological: Negative   Endo/Heme/Allergies: Negative   Psychiatric/Behavioral: Negative    Physical Exam: VS: Blood pressure (!) 136/91, pulse 69, temperature 98.8 F (37.1 C), temperature  source Oral, resp. rate 18, height 5\' 2"  (1.575 m), weight 103.8 kg, last menstrual period 11/02/2019, SpO2 100 %. AAO x3, no signs of distress Cardiovascular: RRR Respiratory: Lung fields clear to ausculation GU/GI: Abdomen gravid, non-tender, non-distended, active FM, vertex Extremities: 2+ edema, negative for pain, tenderness, and cords Neuro: 2+reflexes, neg clonus  Cervical exam:Dilation: Fingertip Effacement (%): 50 Station: -2 Exam by:: Burman Foster, CNM FHR: baseline rate 125 / variability moderate / accelerations present / absent decelerations TOCO: 2-4   Prenatal Transfer Tool  Maternal Diabetes: No Genetic Screening: Normal Maternal Ultrasounds/Referrals: Normal Fetal Ultrasounds or other Referrals:   None Maternal Substance Abuse:  No Significant Maternal Medications:  None Significant Maternal Lab Results: Other: GBS collected and pending    Assessment: 25 y.o. G1P0 [redacted]w[redacted]d IOL for GHTN w/ severe range BP and positive neuro S/S   FHR category 1 GBS PCR collected and pending Pain management plan: plans epidural   Plan:  Admit to L&D Antenatal steroids Q 12 hrs Cytotec 50 mcg oral Q 4 hrs for cervical ripening  Routine admission orders Epidural PRN  Dr Mancel Bale notified of admission and plan of care  Arrie Eastern MSN, CNM 07/23/2020 12:22 AM  Called by MAU provider with recommendation for admission and delivery d/t GHTN at 27 4/7wks while I was simultaneously caring for a pt with code hemorrhage and another pt with unresponsiveness who was on Mg d/t pre-e with severe features.  The pt was sent to L&D and received one dose of cytotec.  Upon deeper dive into the pt's office records, Burman Foster, CNM notified me that pt had an early ultrasound with dates corrected to 36wks.  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  She delayed coming to hospital because there was no one to cover her at work.  In the MAU the pt had BP of 166/101 as well.  Considering the dx of GHTN with severe range BPs and neuro sxs earlier in the day the decision was made to proceed with induction and administer steroids.  If neuro sxs return or BPs persist in severe range, will start MgSO4.  Otherwise will consider starting PP.

## 2020-07-23 NOTE — Progress Notes (Signed)
Subjective:    Doing well. Feels contractions but not painful. SVE. No change. Cytotec placed.   Objective:    VS: BP (!) 155/66   Pulse 93   Temp 98.4 F (36.9 C) (Oral)   Resp 16   Ht 5\' 2"  (1.575 m)   Wt 103.8 kg   LMP 11/02/2019   SpO2 100%   BMI 41.85 kg/m  FHR : baseline 130 / variability moderate / accelerations present / absent decelerations Toco: contractions irregular Membranes: intact Dilation: 1 Effacement (%): 50 Station: -3 Presentation: Vertex Exam by:: k. Linell Shawn  Assessment/Plan:   25 y.o. G1P0 [redacted]w[redacted]d IOL for GHTN with severe range B/P and neuro s/s. Denies HA, visual disturbances or epigastric pain at present.  Labor: IOL. Cytotec x 2 Preeclampsia:  no signs or symptoms of toxicity Fetal Wellbeing:  Category I Pain Control:  Epidural PRN I/D:  GBS negative Anticipated MOD:  NSVD  Domingo Pulse MSN, CNM 07/23/2020 2:30 PM

## 2020-07-23 NOTE — Anesthesia Preprocedure Evaluation (Addendum)
Anesthesia Evaluation  Patient identified by MRN, date of birth, ID band Patient awake    Reviewed: Allergy & Precautions, NPO status , Patient's Chart, lab work & pertinent test results  Airway Mallampati: II  TM Distance: >3 FB Neck ROM: Full    Dental no notable dental hx. (+) Teeth Intact, Dental Advisory Given   Pulmonary neg pulmonary ROS, former smoker,    Pulmonary exam normal breath sounds clear to auscultation       Cardiovascular hypertension (gHtn ), Pt. on medications Normal cardiovascular exam Rhythm:Regular Rate:Normal     Neuro/Psych    GI/Hepatic negative GI ROS, Neg liver ROS,   Endo/Other  negative endocrine ROS  Renal/GU negative Renal ROS     Musculoskeletal   Abdominal (+) + obese,   Peds  Hematology  (+) anemia , Lab Results      Component                Value               Date                      WBC                      8.6                 07/23/2020                HGB                      10.5 (L)            07/23/2020                HCT                      31.8 (L)            07/23/2020                MCV                      91.4                07/23/2020                PLT                      256                 07/23/2020                       Anesthesia Other Findings   Reproductive/Obstetrics (+) Pregnancy                            Anesthesia Physical Anesthesia Plan  ASA: III  Anesthesia Plan: Epidural   Post-op Pain Management:    Induction:   PONV Risk Score and Plan:   Airway Management Planned:   Additional Equipment:   Intra-op Plan:   Post-operative Plan:   Informed Consent: I have reviewed the patients History and Physical, chart, labs and discussed the procedure including the risks, benefits and alternatives for the proposed anesthesia with the patient or authorized representative who has indicated his/her understanding and  acceptance.  Plan Discussed with:   Anesthesia Plan Comments: (36 wk G82P0 w gHtn for LEA)        Anesthesia Quick Evaluation

## 2020-07-23 NOTE — Progress Notes (Signed)
Kimberly Ponce is a 25 y.o. G1P0 at [redacted]w[redacted]d by ultrasound admitted for Severe range blood pressure, HA, visual disturbances.and edema to hands and feet.  Subjective:  Pt sleeping. Woke pt up to discuss SVE and POC. SVE unchanged from previous exam. Attempted foley balloon but was unsuccessful. Ordered cytotec per vagina. Pt tolerated well.  Objective: Vitals:   07/23/20 0501 07/23/20 0604 07/23/20 0709 07/23/20 0803  BP: 122/67 116/70 (!) 132/96 129/80  Pulse: 61 73 83 68  Resp:   20   Temp:      TempSrc:      SpO2:      Weight:      Height:        No intake/output data recorded.    FHT:  FHR: 125 bpm, variability: moderate,  accelerations:  Present,  decelerations:  Absent UC:   none SVE:   Dilation: 1 Effacement (%): 50 Station: -2 Exam by:: K. Azan Maneri, CNM  Labs:   Recent Labs    07/22/20 2008  WBC 6.0  HGB 9.7*  HCT 29.1*  PLT 234    Assessment / Plan: 25 y.o. AGP@ [redacted]w[redacted]d  Labor: IOL for GHTN with severe range b/p and HA, floaters, edema to hands and feet. Preeclampsia:  no signs or symptoms of toxicity Fetal Wellbeing:  Category I Pain Control:  epidural PRN I/D:  GBS negative Anticipated MOD:  NSVD  Dr Charlesetta Garibaldi updated on Pt status and POC reviewed.   Holli Humbles MSN, CNM 07/23/2020, 8:54 AM

## 2020-07-23 NOTE — Progress Notes (Signed)
Subjective:    Kimberly Ponce is resting comfortably. SVE 1/50/-2. Foley balloon placed without difficulty. Pt tolerated well. Uterine balloon 89mls, vaginal balloon 62mls. CAT 1 tracing.   Objective:    VS: BP 123/81   Pulse 70   Temp 98.5 F (36.9 C) (Oral)   Resp 16   Ht 5\' 2"  (1.575 m)   Wt 103.8 kg   LMP 11/02/2019   SpO2 100%   BMI 41.85 kg/m  FHR : baseline 120 / variability moderate / accelerations present / absent decelerations Toco: contractions irregular Membranes: intact  Dilation: 1 Effacement (%): 50 Station: -3 Presentation: Vertex Exam by:: k. Crimson Beer  Assessment/Plan:   25 y.o. G1P0 [redacted]w[redacted]d IOL for GHTN with severe range B/P and neuro s/s. Denies HA, visual disturbances, epigastric pain at present.   Labor: Cytotec oral x1, vaginal x 2. Foley balloon placed. Pitocin 1x1 max of 6 for cervical ripening.  Preeclampsia:  no signs or symptoms of toxicity Fetal Wellbeing:  Category I Pain Control:  IV pain meds , Epidural PRN I/D:  GBS negative Anticipated MOD:  NSVD Dr Charlesetta Garibaldi updated on pt status and Jay MSN, CNM 07/23/2020 8:59 PM

## 2020-07-23 NOTE — Anesthesia Procedure Notes (Signed)
Epidural Patient location during procedure: OB Start time: 07/23/2020 10:37 PM End time: 07/23/2020 10:50 PM  Staffing Anesthesiologist: Barnet Glasgow, MD Performed: anesthesiologist   Preanesthetic Checklist Completed: patient identified, IV checked, site marked, risks and benefits discussed, surgical consent, monitors and equipment checked, pre-op evaluation and timeout performed  Epidural Patient position: sitting Prep: DuraPrep and site prepped and draped Patient monitoring: continuous pulse ox and blood pressure Approach: midline Location: L3-L4 Injection technique: LOR air  Needle:  Needle type: Tuohy  Needle gauge: 17 G Needle length: 9 cm and 9 Needle insertion depth: 7 cm Catheter type: closed end flexible Catheter size: 19 Gauge Catheter at skin depth: 13 cm Test dose: negative  Assessment Events: blood not aspirated, injection not painful, no injection resistance, no paresthesia and negative IV test  Additional Notes Patient identified. Risks/Benefits/Options discussed with patient including but not limited to bleeding, infection, nerve damage, paralysis, failed block, incomplete pain control, headache, blood pressure changes, nausea, vomiting, reactions to medication both or allergic, itching and postpartum back pain. Confirmed with bedside nurse the patient's most recent platelet count. Confirmed with patient that they are not currently taking any anticoagulation, have any bleeding history or any family history of bleeding disorders. Patient expressed understanding and wished to proceed. All questions were answered. Sterile technique was used throughout the entire procedure. Please see nursing notes for vital signs. Test dose was given through epidural needle and negative prior to continuing to dose epidural or start infusion. Warning signs of high block given to the patient including shortness of breath, tingling/numbness in hands, complete motor block, or any  concerning symptoms with instructions to call for help. Patient was given instructions on fall risk and not to get out of bed. All questions and concerns addressed with instructions to call with any issues. 1 Attempt (S) . Patient tolerated procedure well.

## 2020-07-24 ENCOUNTER — Encounter (HOSPITAL_COMMUNITY)
Admission: AD | Disposition: A | Discharge status: Home / Self Care | Payer: Self-pay | Source: Home / Self Care | Attending: Obstetrics & Gynecology

## 2020-07-24 ENCOUNTER — Encounter (HOSPITAL_COMMUNITY): Payer: Self-pay | Admitting: Obstetrics and Gynecology

## 2020-07-24 DIAGNOSIS — O134 Gestational [pregnancy-induced] hypertension without significant proteinuria, complicating childbirth: Secondary | ICD-10-CM | POA: Diagnosis not present

## 2020-07-24 DIAGNOSIS — O1493 Unspecified pre-eclampsia, third trimester: Secondary | ICD-10-CM | POA: Diagnosis not present

## 2020-07-24 DIAGNOSIS — R2990 Unspecified symptoms and signs involving the nervous system: Secondary | ICD-10-CM | POA: Diagnosis present

## 2020-07-24 DIAGNOSIS — O1410 Severe pre-eclampsia, unspecified trimester: Secondary | ICD-10-CM

## 2020-07-24 DIAGNOSIS — Z3A36 36 weeks gestation of pregnancy: Secondary | ICD-10-CM | POA: Diagnosis not present

## 2020-07-24 DIAGNOSIS — Z98891 History of uterine scar from previous surgery: Secondary | ICD-10-CM

## 2020-07-24 DIAGNOSIS — Z3490 Encounter for supervision of normal pregnancy, unspecified, unspecified trimester: Secondary | ICD-10-CM | POA: Diagnosis not present

## 2020-07-24 DIAGNOSIS — O368330 Maternal care for abnormalities of the fetal heart rate or rhythm, third trimester, not applicable or unspecified: Secondary | ICD-10-CM | POA: Diagnosis not present

## 2020-07-24 HISTORY — DX: Unspecified symptoms and signs involving the nervous system: R29.90

## 2020-07-24 HISTORY — DX: Severe pre-eclampsia, unspecified trimester: O14.10

## 2020-07-24 HISTORY — DX: Encounter for supervision of normal pregnancy, unspecified, unspecified trimester: Z34.90

## 2020-07-24 HISTORY — DX: History of uterine scar from previous surgery: Z98.891

## 2020-07-24 LAB — RPR: RPR Ser Ql: NONREACTIVE

## 2020-07-24 LAB — CULTURE, BETA STREP (GROUP B ONLY)

## 2020-07-24 SURGERY — Surgical Case
Anesthesia: Epidural

## 2020-07-24 MED ORDER — PHENYLEPHRINE HCL (PRESSORS) 10 MG/ML IV SOLN
INTRAVENOUS | Status: DC | PRN
  Administered 2020-07-24: 80 ug via INTRAVENOUS

## 2020-07-24 MED ORDER — SODIUM CHLORIDE 0.9 % IV SOLN
INTRAVENOUS | Status: AC
  Filled 2020-07-24: qty 500

## 2020-07-24 MED ORDER — DEXAMETHASONE SODIUM PHOSPHATE 10 MG/ML IJ SOLN
INTRAMUSCULAR | Status: DC | PRN
  Administered 2020-07-24: 5 mg via INTRAVENOUS

## 2020-07-24 MED ORDER — FENTANYL CITRATE (PF) 100 MCG/2ML IJ SOLN
INTRAMUSCULAR | Status: DC | PRN
  Administered 2020-07-24: 100 ug via EPIDURAL

## 2020-07-24 MED ORDER — PHENYLEPHRINE 40 MCG/ML (10ML) SYRINGE FOR IV PUSH (FOR BLOOD PRESSURE SUPPORT)
PREFILLED_SYRINGE | INTRAVENOUS | Status: AC
  Filled 2020-07-24: qty 10

## 2020-07-24 MED ORDER — MORPHINE SULFATE (PF) 0.5 MG/ML IJ SOLN
INTRAMUSCULAR | Status: DC | PRN
  Administered 2020-07-24: 3 ug via EPIDURAL

## 2020-07-24 MED ORDER — DEXAMETHASONE SODIUM PHOSPHATE 10 MG/ML IJ SOLN
INTRAMUSCULAR | Status: AC
  Filled 2020-07-24: qty 1

## 2020-07-24 MED ORDER — FENTANYL CITRATE (PF) 100 MCG/2ML IJ SOLN
INTRAMUSCULAR | Status: AC
  Filled 2020-07-24: qty 2

## 2020-07-24 MED ORDER — ONDANSETRON HCL 4 MG/2ML IJ SOLN
INTRAMUSCULAR | Status: AC
  Filled 2020-07-24: qty 2

## 2020-07-24 MED ORDER — CEFAZOLIN SODIUM-DEXTROSE 2-4 GM/100ML-% IV SOLN
2.0000 g | INTRAVENOUS | Status: AC
  Administered 2020-07-24: 2 g via INTRAVENOUS

## 2020-07-24 MED ORDER — SOD CITRATE-CITRIC ACID 500-334 MG/5ML PO SOLN
30.0000 mL | ORAL | Status: AC
Start: 2020-07-24 — End: 2020-07-24
  Administered 2020-07-24: 30 mL via ORAL

## 2020-07-24 MED ORDER — OXYTOCIN-SODIUM CHLORIDE 30-0.9 UT/500ML-% IV SOLN
INTRAVENOUS | Status: DC | PRN
  Administered 2020-07-24: 300 mL via INTRAVENOUS
  Administered 2020-07-25: 100 mL via INTRAVENOUS

## 2020-07-24 MED ORDER — ONDANSETRON HCL 4 MG/2ML IJ SOLN
INTRAMUSCULAR | Status: DC | PRN
  Administered 2020-07-24: 4 mg via INTRAVENOUS

## 2020-07-24 MED ORDER — MORPHINE SULFATE (PF) 0.5 MG/ML IJ SOLN
INTRAMUSCULAR | Status: AC
  Filled 2020-07-24: qty 10

## 2020-07-24 MED ORDER — LIDOCAINE-EPINEPHRINE (PF) 2 %-1:200000 IJ SOLN
INTRAMUSCULAR | Status: DC | PRN
  Administered 2020-07-24 (×2): 5 mL via EPIDURAL

## 2020-07-24 MED ORDER — SODIUM CHLORIDE 0.9 % IV SOLN
500.0000 mg | INTRAVENOUS | Status: AC
  Administered 2020-07-24: 500 mg via INTRAVENOUS

## 2020-07-24 MED ORDER — OXYTOCIN-SODIUM CHLORIDE 30-0.9 UT/500ML-% IV SOLN
INTRAVENOUS | Status: AC
  Filled 2020-07-24: qty 500

## 2020-07-24 MED ORDER — CEFAZOLIN SODIUM-DEXTROSE 2-4 GM/100ML-% IV SOLN
INTRAVENOUS | Status: AC
  Filled 2020-07-24: qty 100

## 2020-07-24 SURGICAL SUPPLY — 39 items
BENZOIN TINCTURE PRP APPL 2/3 (GAUZE/BANDAGES/DRESSINGS) ×2 IMPLANT
CHLORAPREP W/TINT 26 (MISCELLANEOUS) ×2 IMPLANT
CLAMP CORD UMBIL (MISCELLANEOUS) IMPLANT
CLOSURE STERI STRIP 1/2 X4 (GAUZE/BANDAGES/DRESSINGS) ×2 IMPLANT
CLOTH BEACON ORANGE TIMEOUT ST (SAFETY) ×2 IMPLANT
DRAPE C SECTION CLR SCREEN (DRAPES) ×2 IMPLANT
DRSG OPSITE POSTOP 4X10 (GAUZE/BANDAGES/DRESSINGS) ×2 IMPLANT
ELECT REM PT RETURN 9FT ADLT (ELECTROSURGICAL) ×2
ELECTRODE REM PT RTRN 9FT ADLT (ELECTROSURGICAL) ×1 IMPLANT
EXTRACTOR VACUUM KIWI (MISCELLANEOUS) IMPLANT
GLOVE BIO SURGEON STRL SZ7 (GLOVE) ×2 IMPLANT
GLOVE BIOGEL PI IND STRL 7.0 (GLOVE) ×2 IMPLANT
GLOVE BIOGEL PI INDICATOR 7.0 (GLOVE) ×2
GLOVE SURG POLYISO LF SZ6.5 (GLOVE) ×2 IMPLANT
GOWN STRL REUS W/ TWL LRG LVL3 (GOWN DISPOSABLE) ×3 IMPLANT
GOWN STRL REUS W/TWL LRG LVL3 (GOWN DISPOSABLE) ×6
KIT ABG SYR 3ML LUER SLIP (SYRINGE) IMPLANT
NEEDLE HYPO 25X5/8 SAFETYGLIDE (NEEDLE) IMPLANT
NS IRRIG 1000ML POUR BTL (IV SOLUTION) ×2 IMPLANT
PACK C SECTION WH (CUSTOM PROCEDURE TRAY) ×2 IMPLANT
PAD ABD 7.5X8 STRL (GAUZE/BANDAGES/DRESSINGS) ×2 IMPLANT
PAD OB MATERNITY 4.3X12.25 (PERSONAL CARE ITEMS) ×2 IMPLANT
PENCIL SMOKE EVAC W/HOLSTER (ELECTROSURGICAL) ×2 IMPLANT
RETAINER VISCERAL (MISCELLANEOUS) ×2 IMPLANT
RTRCTR C-SECT PINK 25CM LRG (MISCELLANEOUS) ×2 IMPLANT
SPONGE GAUZE 4X4 12PLY STER LF (GAUZE/BANDAGES/DRESSINGS) ×4 IMPLANT
STRIP CLOSURE SKIN 1/2X4 (GAUZE/BANDAGES/DRESSINGS) ×2 IMPLANT
SUT MNCRL 0 VIOLET CTX 36 (SUTURE) ×3 IMPLANT
SUT MNCRL+ AB 3-0 CT1 36 (SUTURE) ×2 IMPLANT
SUT MONOCRYL 0 CTX 36 (SUTURE) ×6
SUT MONOCRYL AB 3-0 CT1 36IN (SUTURE) ×4
SUT PDS AB 0 CTX 36 PDP370T (SUTURE) ×4 IMPLANT
SUT PLAIN 0 NONE (SUTURE) IMPLANT
SUT VIC AB 2-0 CT1 27 (SUTURE) ×2
SUT VIC AB 2-0 CT1 TAPERPNT 27 (SUTURE) ×1 IMPLANT
SUT VIC AB 4-0 KS 27 (SUTURE) ×2 IMPLANT
TOWEL OR 17X24 6PK STRL BLUE (TOWEL DISPOSABLE) ×4 IMPLANT
TRAY FOLEY W/BAG SLVR 14FR LF (SET/KITS/TRAYS/PACK) ×2 IMPLANT
WATER STERILE IRR 1000ML POUR (IV SOLUTION) ×2 IMPLANT

## 2020-07-24 NOTE — Progress Notes (Addendum)
Labor Progress Note  Kimberly Ponce is a 25 y.o. female G1P0 [redacted]w[redacted]d presenting for BP eval. Pt reports swelling of hands and feet while at work. She had her BP taken at work and it was found to be "160's/90's". Pt also had a mild HA, mild RUQ pain, and no visual changes. Pt called the office and was advised to be seen. Pt states she was not allowed to leave work and stayed until the end of her shift. Severe range BP noted in MAU. Lab PCR 0.06, cmp, cbc unremarkable. GBS-.  HA was resolved when pt arrived to MAU. Reports worsening of swelling in hands and feet. Endorses active FM, denies LOF and vaginal bleeding. On 4/26 pt dx with GHTN with SR BP and neuro s/sx, sent for IOL, currently s/p Cytotec x4, foley bulb, SROM and on pitocin. No progression in latent labor with IUPC, MVU 200 now. No cervical change noted in >6 hours with SROM, and adequate cxt.   Subjective: Pt comfortable with epidural in bed and stable. Late decel noted at 1908, pitocin on 30, position change, fluid bolus given. Early and variable noted. Decreased pitocin to 8. No cervical change noted. Fetal caput noted. Patient Active Problem List   Diagnosis Date Noted  . Neurological symptoms 07/24/2020  . Severely increased blood pressure and edema during pregnancy 07/24/2020  . Encounter for induction of labor 07/24/2020  . Gestational hypertension 07/23/2020   Objective: BP 139/89   Pulse 75   Temp 98.2 F (36.8 C) (Oral)   Resp 18   Ht 5\' 2"  (1.575 m)   Wt 103.8 kg   LMP 11/02/2019   SpO2 98%   BMI 41.85 kg/m  I/O last 3 completed shifts: In: 3715.5 [I.V.:3715.5] Out: 775 [Urine:775] No intake/output data recorded. NST: FHR baseline 125 bpm, Variability: moderate, Accelerations:present, Decelerations:  Absent= Cat 2/Reactive CTX:  regular, every 2-4 minutes Uterus gravid, soft non tender, moderate to palpate with contractions.  SVE:  Dilation: 5 Effacement (%): 80 Station: 0 Exam by:: [facility],  CNM Pitocin at 30 and decreased to 8 mUn/min  Assessment:  Kimberly Ponce is a 25 y.o. female G1P0 [redacted]w[redacted]d presenting for BP eval. Pt reports swelling of hands and feet while at work. She had her BP taken at work and it was found to be "160's/90's". Pt also had a mild HA, mild RUQ pain, and no visual changes. Pt called the office and was advised to be seen. Pt states she was not allowed to leave work and stayed until the end of her shift. Severe range BP noted in MAU. Lab PCR 0.06, cmp, cbc unremarkable. GBS-.  HA was resolved when pt arrived to MAU. Reports worsening of swelling in hands and feet. Endorses active FM, denies LOF and vaginal bleeding. On 4/26 pt dx with GHTN with SR BP and neuro s/sx, sent for IOL, currently s/p Cytotec x4, foley bulb, SROM and on pitocin. No cervical change noted in >6 hours with SROM, and adequate cxt.   Patient Active Problem List   Diagnosis Date Noted  . Neurological symptoms 07/24/2020  . Severely increased blood pressure and edema during pregnancy 07/24/2020  . Encounter for induction of labor 07/24/2020  . Gestational hypertension 07/23/2020   NICHD: Category 2  Position change, fluid bolus, decreased pitocin   Membranes:  SROM, clear at 0800 on 4/27, forebag ruptured at 1150 on 4/27 for IUPC polacment, no s/s of infection  MVUs 200, IUPC placed, tolerated well.   Induction:  Cytotec x on 4/26 @ 0028, 0950, 1402  Foley Bulb: inserted  4/26 @ 2000, out 0245 on 4/27  Pitocin - 30 decreased to 8  Pain management:               IV pain management: x 1 dose fentanyl @ 2051 on 4/26             Epidural placement:  at 2210 on 4/26  GBS Negative  GHTN with SR BP with neuro symptoms in preterm pt: BP now 135/81, labs unremarkable, BMZ x2 doses given Q12 on 4/26.  Plan: Continue labor plan GHTN with SR BP with neuro symptoms in preterm pt: NO mag now, BP stable , pt currently asymptomatic. Repeat CBC and CMP in morning, plan to start mag PP.   >160/110. Will call NICU for delivery.  Continuous monitoring Rest Frequent position changes to facilitate fetal rotation and descent. D/c pitocin per protocol Proceed with PCR for fetal intolerance, Dr Delora Fuel en route to hospital.   Md Delora Fuel aware of plan and verbalized POCt.   Noralyn Pick, NP-C, CNM, MSN 07/24/2020. 9:09 PM

## 2020-07-24 NOTE — Progress Notes (Signed)
Into room to discuss indication for cesarean section with patient. In review, FHT non-reassuring with recurrent late decelerations, variable decelerations with long periods of minimal variability. Ruptured since ~0800 and no cervical change 5cm since about that time as well. Recommend moving forward with cesarean section - patient in agreement. Consents were signed. L&D staff, anesthesia charge RN, and anesthesiologist aware. Ancef 2g and Azithromycin 500mg  on call to OR ordered. Will start Magnesium in postpartum period.  I have explained to the patient that this surgery is performed to deliver their baby or babies through an incision in the abdomen and incision in the uterus.  Prior to surgery, the risks and benefits of the surgery, as well as alternative treatments were discussed.  The risks include, but are not limited to, injury to surrounding organs, blood loss, need for blood transfusion, infection, and VTE. Patient was consented for blood products.  The patient is aware that bleeding may result in the need for a blood transfusion which includes risk of transmission of HIV (1:2 million), Hepatitis C (1:2 million), and Hepatitis B (1:200 thousand) and transfusion reaction.  Patient voiced understanding of the above risks as well as understanding of indications for blood transfusion.  Drema Dallas, DO

## 2020-07-24 NOTE — Progress Notes (Signed)
Labor Progress Note  Kimberly Ponce is a 25 y.o. female G1P0 [redacted]w[redacted]d presenting for BP eval. Pt reports swelling of hands and feet while at work. She had her BP taken at work and it was found to be "160's/90's". Pt also had a mild HA, mild RUQ pain, and no visual changes. Pt called the office and was advised to be seen. Pt states she was not allowed to leave work and stayed until the end of her shift. Severe range BP noted in MAU. Lab PCR 0.06, cmp, cbc unremarkable. GBS-.  HA was resolved when pt arrived to MAU. Reports worsening of swelling in hands and feet. Endorses active FM, denies LOF and vaginal bleeding. On 4/26 pt dx with GHTN with SR BP and neuro s/sx, sent for IOL, currently s/p Cytotec x4, foley bulb, SROM and on pitocin. Progressing in latent labor with IUPC, MVU 150 now.   Subjective: Pt endorses feeling great, Pt denies HA, RUQ pain or vision changes currently. Discussed in length in report about pt, pt dx with GHTN with severe range BP (Not documented in hospital) with neuro symptoms. Plan to continue with IOL process per DR Dillard and DR Mancel Bale. Discusses with pt and pt denies any questions and verbalized understanding of plan. RN report at 0800 small gush of fluid that increase with coughing and appeared clear. Upon exam forebag noted and ruptured.  Patient Active Problem List   Diagnosis Date Noted  . Gestational hypertension 07/23/2020   Objective: BP 129/85   Pulse 78   Temp 97.9 F (36.6 C) (Axillary)   Resp 17   Ht 5\' 2"  (1.575 m)   Wt 103.8 kg   LMP 11/02/2019   SpO2 98%   BMI 41.85 kg/m  I/O last 3 completed shifts: In: 3715.5 [I.V.:3715.5] Out: -  Total I/O In: -  Out: 225 [Urine:225] NST: FHR baseline 125 bpm, Variability: moderate, Accelerations:present, Decelerations:  Absent= Cat 1/Reactive CTX:  regular, every 4-5 minutes Uterus gravid, soft non tender, moderate to palpate with contractions.  SVE:  Dilation: 5 Effacement (%): Thick Station:  -3 Exam by:: j montanta cnm Pitocin at 14 mUn/min  Assessment:  Kimberly Ponce is a 25 y.o. female G1P0 [redacted]w[redacted]d presenting for BP eval. Pt reports swelling of hands and feet while at work. She had her BP taken at work and it was found to be "160's/90's". Pt also had a mild HA, mild RUQ pain, and no visual changes. Pt called the office and was advised to be seen. Pt states she was not allowed to leave work and stayed until the end of her shift. Severe range BP noted in MAU. Lab PCR 0.06, cmp, cbc unremarkable. GBS-.  HA was resolved when pt arrived to MAU. Reports worsening of swelling in hands and feet. Endorses active FM, denies LOF and vaginal bleeding. On 4/26 pt dx with GHTN with SR BP and neuro s/sx, sent for IOL, currently s/p Cytotec x4, foley bulb, SROM and on pitocin. Progressing in latent labor with IUPC, MVU 150 now.  Patient Active Problem List   Diagnosis Date Noted  . Gestational hypertension 07/23/2020   NICHD: Category 1  Membranes:  SROM, clear at 0800 on 4/27, forebag ruptured at 1150 on 4/27 for IUPC polacment, no s/s of infection  MVUs 150, IUPC placed, tolerated well.   Induction:    Cytotec x on 4/26 @ 0028, 0950, 1402  Foley Bulb: inserted  4/26 @ 2000, out 0245 on 4/27  Pitocin - 14  Pain management:               IV pain management: x 1 dose fentanyl @ 2051 on 4/26             Epidural placement:  at 2210 on 4/26  GBS Negative  GHTN with SR BP with neuro symptoms in preterm pt: BP now 108/59, labs unremarkable, BMZ x2 doses given Q12 on 4/26.  Plan: Continue labor plan GHTN with SR BP with neuro symptoms in preterm pt: NO mag now, BP stable , pt currently asymptomatic. Repeat CBC and CMP in morning, plan to reassess for PP mag, will star if pt declines or BP >160/110. Will call NICU for delivery.  Continuous monitoring Rest Ambulate Frequent position changes to facilitate fetal rotation and descent. Will reassess with cervical exam at 4 hours or  earlier if necessary Continue pitocin per protocol Anticipate labor progression and vaginal delivery.   Md Mancel Bale aware of plan and verbalized POCt.   Noralyn Pick, NP-C, CNM, MSN 07/24/2020. 2:47 PM

## 2020-07-24 NOTE — Progress Notes (Signed)
Subjective:    Pt resting comfortably with epidural. Foley balloon out. FHR tracing reviewed.   Objective:    VS: BP 101/60   Pulse (!) 103   Temp 98.4 F (36.9 C) (Axillary)   Resp 16   Ht 5\' 2"  (1.575 m)   Wt 103.8 kg   LMP 11/02/2019   SpO2 98%   BMI 41.85 kg/m  FHR : baseline 125 / variability moderate / accelerations present / absent decelerations Toco: contractions every 1-4 minutes  Membranes: intact Dilation: 4.5 Effacement (%): 60 Cervical Position: Middle Station: -3,-2 Presentation: Vertex Exam by:: MScott, RN Pitocin 2 mU/min  Assessment/Plan:   25 y.o. G1P0 [redacted]w[redacted]d IOL for GHTN with severe range b/p and neuro s/s. Denies HA, visual disturbances or epigastric pain at present.  Labor: Progressing on Pitocin, will continue to increase then AROM Preeclampsia:  no signs or symptoms of toxicity Fetal Wellbeing:  Category I Pain Control:  Epidural I/D:  GBS negative Anticipated MOD:  NSVD  Domingo Pulse MSN, CNM 07/24/2020 3:14 AM

## 2020-07-24 NOTE — Progress Notes (Signed)
Labor Progress Note  Elzina Prapti Grussing is a 25 y.o. female G1P0 [redacted]w[redacted]d presenting for BP eval. Pt reports swelling of hands and feet while at work. She had her BP taken at work and it was found to be "160's/90's". Pt also had a mild HA, mild RUQ pain, and no visual changes. Pt called the office and was advised to be seen. Pt states she was not allowed to leave work and stayed until the end of her shift. Severe range BP noted in MAU. Lab PCR 0.06, cmp, cbc unremarkable. GBS-.  HA was resolved when pt arrived to MAU. Reports worsening of swelling in hands and feet. Endorses active FM, denies LOF and vaginal bleeding. On 4/26 pt dx with GHTN with SR BP and neuro s/sx, sent for IOL, currently s/p Cytotec x4, foley bulb, SROM and on pitocin. Progressing in latent labor with IUPC, MVU 150 now.   Subjective: Pt comfortable with epidural in bed and stable.  Patient Active Problem List   Diagnosis Date Noted  . Neurological symptoms 07/24/2020  . Severely increased blood pressure and edema during pregnancy 07/24/2020  . Encounter for induction of labor 07/24/2020  . Gestational hypertension 07/23/2020   Objective: BP 135/81   Pulse 75   Temp 98.2 F (36.8 C) (Oral)   Resp 18   Ht 5\' 2"  (1.575 m)   Wt 103.8 kg   LMP 11/02/2019   SpO2 98%   BMI 41.85 kg/m  I/O last 3 completed shifts: In: 3715.5 [I.V.:3715.5] Out: 775 [Urine:775] No intake/output data recorded. NST: FHR baseline 125 bpm, Variability: moderate, Accelerations:present, Decelerations:  Absent= Cat 1/Reactive CTX:  regular, every 3-4 minutes Uterus gravid, soft non tender, moderate to palpate with contractions.  SVE:  Dilation: 5 Effacement (%): 70 Station: 0 Exam by:: j Jyden Kromer cnm Pitocin at 24 mUn/min  Assessment:  Harriette Arika Mainer is a 24 y.o. female G1P0 [redacted]w[redacted]d presenting for BP eval. Pt reports swelling of hands and feet while at work. She had her BP taken at work and it was found to be "160's/90's". Pt also had a  mild HA, mild RUQ pain, and no visual changes. Pt called the office and was advised to be seen. Pt states she was not allowed to leave work and stayed until the end of her shift. Severe range BP noted in MAU. Lab PCR 0.06, cmp, cbc unremarkable. GBS-.  HA was resolved when pt arrived to MAU. Reports worsening of swelling in hands and feet. Endorses active FM, denies LOF and vaginal bleeding. On 4/26 pt dx with GHTN with SR BP and neuro s/sx, sent for IOL, currently s/p Cytotec x4, foley bulb, SROM and on pitocin. Progressing in latent labor with IUPC, MVU 200 now.  Patient Active Problem List   Diagnosis Date Noted  . Neurological symptoms 07/24/2020  . Severely increased blood pressure and edema during pregnancy 07/24/2020  . Encounter for induction of labor 07/24/2020  . Gestational hypertension 07/23/2020   NICHD: Category 1  Membranes:  SROM, clear at 0800 on 4/27, forebag ruptured at 1150 on 4/27 for IUPC polacment, no s/s of infection  MVUs 200, IUPC placed, tolerated well.   Induction:    Cytotec x on 4/26 @ 0028, 0950, 1402  Foley Bulb: inserted  4/26 @ 2000, out 0245 on 4/27  Pitocin - 24  Pain management:               IV pain management: x 1 dose fentanyl @ 2051 on 4/26  Epidural placement:  at 2210 on 4/26  GBS Negative  GHTN with SR BP with neuro symptoms in preterm pt: BP now 135/81, labs unremarkable, BMZ x2 doses given Q12 on 4/26.  Plan: Continue labor plan GHTN with SR BP with neuro symptoms in preterm pt: NO mag now, BP stable , pt currently asymptomatic. Repeat CBC and CMP in morning, plan to reassess for PP mag, will star if pt declines or BP >160/110. Will call NICU for delivery.  Continuous monitoring Rest Ambulate Frequent position changes to facilitate fetal rotation and descent. Will reassess with cervical exam at 4 hours or earlier if necessary Continue pitocin per protocol Anticipate labor progression and vaginal delivery.   Md Mancel Bale  aware of plan and verbalized POCt.   Noralyn Pick, NP-C, CNM, MSN 07/24/2020. 7:29 PM

## 2020-07-25 ENCOUNTER — Encounter (HOSPITAL_COMMUNITY): Payer: Self-pay | Admitting: Obstetrics and Gynecology

## 2020-07-25 DIAGNOSIS — O1413 Severe pre-eclampsia, third trimester: Secondary | ICD-10-CM

## 2020-07-25 LAB — COMPREHENSIVE METABOLIC PANEL
ALT: 12 U/L (ref 0–44)
AST: 15 U/L (ref 15–41)
Albumin: 2.4 g/dL — ABNORMAL LOW (ref 3.5–5.0)
Alkaline Phosphatase: 82 U/L (ref 38–126)
Anion gap: 10 (ref 5–15)
BUN: 8 mg/dL (ref 6–20)
CO2: 20 mmol/L — ABNORMAL LOW (ref 22–32)
Calcium: 8.2 mg/dL — ABNORMAL LOW (ref 8.9–10.3)
Chloride: 103 mmol/L (ref 98–111)
Creatinine, Ser: 0.69 mg/dL (ref 0.44–1.00)
GFR, Estimated: >60 mL/min (ref >60)
Glucose, Bld: 98 mg/dL (ref 70–99)
Potassium: 3.7 mmol/L (ref 3.5–5.1)
Sodium: 133 mmol/L — ABNORMAL LOW (ref 135–145)
Total Bilirubin: 0.4 mg/dL (ref 0.3–1.2)
Total Protein: 5.2 g/dL — ABNORMAL LOW (ref 6.5–8.1)

## 2020-07-25 LAB — CBC WITH DIFFERENTIAL/PLATELET
Abs Immature Granulocytes: 0.06 10*3/uL (ref 0.00–0.07)
Basophils Absolute: 0 10*3/uL (ref 0.0–0.1)
Basophils Relative: 0 %
Eosinophils Absolute: 0 10*3/uL (ref 0.0–0.5)
Eosinophils Relative: 0 %
HCT: 28.3 % — ABNORMAL LOW (ref 36.0–46.0)
Hemoglobin: 9.3 g/dL — ABNORMAL LOW (ref 12.0–15.0)
Immature Granulocytes: 0 %
Lymphocytes Relative: 6 %
Lymphs Abs: 0.9 10*3/uL (ref 0.7–4.0)
MCH: 30.4 pg (ref 26.0–34.0)
MCHC: 32.9 g/dL (ref 30.0–36.0)
MCV: 92.5 fL (ref 80.0–100.0)
Monocytes Absolute: 0.5 10*3/uL (ref 0.1–1.0)
Monocytes Relative: 3 %
Neutro Abs: 12.8 10*3/uL — ABNORMAL HIGH (ref 1.7–7.7)
Neutrophils Relative %: 91 %
Platelets: 230 10*3/uL (ref 150–400)
RBC: 3.06 MIL/uL — ABNORMAL LOW (ref 3.87–5.11)
RDW: 13.7 % (ref 11.5–15.5)
WBC: 14.2 10*3/uL — ABNORMAL HIGH (ref 4.0–10.5)
nRBC: 0 % (ref 0.0–0.2)

## 2020-07-25 LAB — CBC
HCT: 28 % — ABNORMAL LOW (ref 36.0–46.0)
Hemoglobin: 9.3 g/dL — ABNORMAL LOW (ref 12.0–15.0)
MCH: 30.7 pg (ref 26.0–34.0)
MCHC: 33.2 g/dL (ref 30.0–36.0)
MCV: 92.4 fL (ref 80.0–100.0)
Platelets: 224 10*3/uL (ref 150–400)
RBC: 3.03 MIL/uL — ABNORMAL LOW (ref 3.87–5.11)
RDW: 13.6 % (ref 11.5–15.5)
WBC: 12.8 10*3/uL — ABNORMAL HIGH (ref 4.0–10.5)
nRBC: 0 % (ref 0.0–0.2)

## 2020-07-25 MED ORDER — KETOROLAC TROMETHAMINE 30 MG/ML IJ SOLN: 30.0000 mg | Freq: Once | INTRAMUSCULAR | Status: DC | PRN

## 2020-07-25 MED ORDER — SCOPOLAMINE 1 MG/3DAYS TD PT72
MEDICATED_PATCH | TRANSDERMAL | Status: AC
  Filled 2020-07-25: qty 1

## 2020-07-25 MED ORDER — MAGNESIUM SULFATE BOLUS VIA INFUSION
4.0000 g | Freq: Once | INTRAVENOUS | Status: AC
  Administered 2020-07-25: 4 g via INTRAVENOUS
  Filled 2020-07-25: qty 1000

## 2020-07-25 MED ORDER — TETANUS-DIPHTH-ACELL PERTUSSIS 5-2.5-18.5 LF-MCG/0.5 IM SUSY: 0.5000 mL | PREFILLED_SYRINGE | Freq: Once | INTRAMUSCULAR | Status: DC

## 2020-07-25 MED ORDER — SENNOSIDES-DOCUSATE SODIUM 8.6-50 MG PO TABS
2.0000 | ORAL_TABLET | Freq: Every day | ORAL | Status: DC
  Administered 2020-07-26 – 2020-07-28 (×3): 2 via ORAL
  Filled 2020-07-25 (×3): qty 2

## 2020-07-25 MED ORDER — ONDANSETRON HCL 4 MG/2ML IJ SOLN: 4.0000 mg | Freq: Once | INTRAMUSCULAR | Status: DC | PRN

## 2020-07-25 MED ORDER — PRENATAL MULTIVITAMIN CH
1.0000 | ORAL_TABLET | Freq: Every day | ORAL | Status: DC
  Administered 2020-07-25 – 2020-07-28 (×4): 1 via ORAL
  Filled 2020-07-25 (×4): qty 1

## 2020-07-25 MED ORDER — NALBUPHINE HCL 10 MG/ML IJ SOLN: 5.0000 mg | Freq: Once | INTRAMUSCULAR | Status: DC | PRN

## 2020-07-25 MED ORDER — OXYCODONE HCL 5 MG PO TABS
5.0000 mg | ORAL_TABLET | ORAL | Status: DC | PRN
  Administered 2020-07-27: 5 mg via ORAL
  Filled 2020-07-25: qty 1

## 2020-07-25 MED ORDER — LABETALOL HCL 5 MG/ML IV SOLN: 80.0000 mg | INTRAVENOUS | Status: DC | PRN

## 2020-07-25 MED ORDER — HYDROMORPHONE HCL 1 MG/ML IJ SOLN: 0.2500 mg | INTRAMUSCULAR | Status: DC | PRN

## 2020-07-25 MED ORDER — DIPHENHYDRAMINE HCL 25 MG PO CAPS: 25.0000 mg | ORAL_CAPSULE | Freq: Four times a day (QID) | ORAL | Status: DC | PRN

## 2020-07-25 MED ORDER — NALBUPHINE HCL 10 MG/ML IJ SOLN: 5.0000 mg | INTRAMUSCULAR | Status: DC | PRN

## 2020-07-25 MED ORDER — OXYCODONE HCL 5 MG/5ML PO SOLN: 5.0000 mg | Freq: Once | ORAL | Status: DC | PRN

## 2020-07-25 MED ORDER — NALOXONE HCL 4 MG/10ML IJ SOLN
1.0000 ug/kg/h | INTRAVENOUS | Status: DC | PRN
  Filled 2020-07-25: qty 5

## 2020-07-25 MED ORDER — ENOXAPARIN SODIUM 40 MG/0.4ML ~~LOC~~ SOLN
40.0000 mg | SUBCUTANEOUS | Status: DC
  Administered 2020-07-25 – 2020-07-27 (×3): 40 mg via SUBCUTANEOUS
  Filled 2020-07-25 (×3): qty 0.4

## 2020-07-25 MED ORDER — SIMETHICONE 80 MG PO CHEW
80.0000 mg | CHEWABLE_TABLET | ORAL | Status: DC | PRN
Start: 2020-07-25 — End: 2020-07-28

## 2020-07-25 MED ORDER — NALOXONE HCL 0.4 MG/ML IJ SOLN: 0.4000 mg | INTRAMUSCULAR | Status: DC | PRN

## 2020-07-25 MED ORDER — SODIUM CHLORIDE 0.9 % IR SOLN
Status: DC | PRN
  Administered 2020-07-25: 1

## 2020-07-25 MED ORDER — MAGNESIUM SULFATE 40 GM/1000ML IV SOLN
2.0000 g/h | INTRAVENOUS | Status: AC
  Administered 2020-07-25 (×2): 2 g/h via INTRAVENOUS
  Filled 2020-07-25 (×2): qty 1000

## 2020-07-25 MED ORDER — LACTATED RINGERS IV SOLN: INTRAVENOUS | Status: DC

## 2020-07-25 MED ORDER — ONDANSETRON HCL 4 MG/2ML IJ SOLN: 4.0000 mg | Freq: Three times a day (TID) | INTRAMUSCULAR | Status: DC | PRN

## 2020-07-25 MED ORDER — SIMETHICONE 80 MG PO CHEW
80.0000 mg | CHEWABLE_TABLET | Freq: Three times a day (TID) | ORAL | Status: DC
  Administered 2020-07-25 – 2020-07-28 (×11): 80 mg via ORAL
  Filled 2020-07-25 (×11): qty 1

## 2020-07-25 MED ORDER — STERILE WATER FOR IRRIGATION IR SOLN
Status: DC | PRN
  Administered 2020-07-25: 1

## 2020-07-25 MED ORDER — LACTATED RINGERS IV SOLN: INTRAVENOUS | Status: DC | PRN

## 2020-07-25 MED ORDER — KETOROLAC TROMETHAMINE 30 MG/ML IJ SOLN
30.0000 mg | Freq: Four times a day (QID) | INTRAMUSCULAR | Status: AC
  Administered 2020-07-25 (×4): 30 mg via INTRAVENOUS
  Filled 2020-07-25 (×4): qty 1

## 2020-07-25 MED ORDER — OXYCODONE HCL 5 MG PO TABS
5.0000 mg | ORAL_TABLET | Freq: Once | ORAL | Status: DC | PRN
Start: 2020-07-25 — End: 2020-07-25

## 2020-07-25 MED ORDER — LABETALOL HCL 5 MG/ML IV SOLN: 20.0000 mg | INTRAVENOUS | Status: DC | PRN

## 2020-07-25 MED ORDER — ACETAMINOPHEN 10 MG/ML IV SOLN
INTRAVENOUS | Status: DC | PRN
  Administered 2020-07-25: 1000 mg via INTRAVENOUS

## 2020-07-25 MED ORDER — DIPHENHYDRAMINE HCL 25 MG PO CAPS: 25.0000 mg | ORAL_CAPSULE | ORAL | Status: DC | PRN

## 2020-07-25 MED ORDER — ACETAMINOPHEN 10 MG/ML IV SOLN
INTRAVENOUS | Status: AC
  Filled 2020-07-25: qty 100

## 2020-07-25 MED ORDER — COCONUT OIL OIL
1.0000 | TOPICAL_OIL | Status: DC | PRN
Start: 2020-07-25 — End: 2020-07-28

## 2020-07-25 MED ORDER — OXYTOCIN-SODIUM CHLORIDE 30-0.9 UT/500ML-% IV SOLN
2.5000 [IU]/h | INTRAVENOUS | Status: AC
  Administered 2020-07-25: 2.5 [IU]/h via INTRAVENOUS

## 2020-07-25 MED ORDER — SCOPOLAMINE 1 MG/3DAYS TD PT72
1.0000 | MEDICATED_PATCH | Freq: Once | TRANSDERMAL | Status: AC
  Administered 2020-07-25: 1.5 mg via TRANSDERMAL

## 2020-07-25 MED ORDER — ACETAMINOPHEN 500 MG PO TABS
1000.0000 mg | ORAL_TABLET | Freq: Four times a day (QID) | ORAL | Status: DC
  Administered 2020-07-25 – 2020-07-28 (×13): 1000 mg via ORAL
  Filled 2020-07-25 (×13): qty 2

## 2020-07-25 MED ORDER — FERROUS SULFATE 325 (65 FE) MG PO TABS
325.0000 mg | ORAL_TABLET | Freq: Two times a day (BID) | ORAL | Status: DC
  Administered 2020-07-25 – 2020-07-28 (×7): 325 mg via ORAL
  Filled 2020-07-25 (×7): qty 1

## 2020-07-25 MED ORDER — IBUPROFEN 600 MG PO TABS
600.0000 mg | ORAL_TABLET | Freq: Four times a day (QID) | ORAL | Status: DC
  Administered 2020-07-26 – 2020-07-28 (×9): 600 mg via ORAL
  Filled 2020-07-25 (×9): qty 1

## 2020-07-25 MED ORDER — HYDRALAZINE HCL 20 MG/ML IJ SOLN: 10.0000 mg | INTRAMUSCULAR | Status: DC | PRN

## 2020-07-25 MED ORDER — MENTHOL 3 MG MT LOZG: 1.0000 | LOZENGE | OROMUCOSAL | Status: DC | PRN

## 2020-07-25 MED ORDER — DIPHENHYDRAMINE HCL 50 MG/ML IJ SOLN: 12.5000 mg | INTRAMUSCULAR | Status: DC | PRN

## 2020-07-25 MED ORDER — SODIUM CHLORIDE 0.9% FLUSH: 3.0000 mL | INTRAVENOUS | Status: DC | PRN

## 2020-07-25 MED ORDER — LABETALOL HCL 5 MG/ML IV SOLN: 40.0000 mg | INTRAVENOUS | Status: DC | PRN

## 2020-07-25 MED ORDER — ZOLPIDEM TARTRATE 5 MG PO TABS: 5.0000 mg | ORAL_TABLET | Freq: Every evening | ORAL | Status: DC | PRN

## 2020-07-25 NOTE — Progress Notes (Addendum)
Subjective: POD# 1 Information for the patient's newborn:  Kimberly Ponce, Kimberly Ponce [[id number]]  female    78 Name Kimberly Ponce Circumcision desires in-pt circ  Reports feeling "pretty good", denies HA, visual changes, and RUG pain Feeding: breast Reports tolerating PO and denies N/V, foley removed, ambulating and urinating w/o difficulty  Pain controlled with PO meds Denies HA/SOB/dizziness  Flatus not-passing Vaginal bleeding is normal, no clots     Objective:  VS:  Vitals:   07/25/20 0700 07/25/20 0735 07/25/20 0900 07/25/20 0905  BP: (!) 106/53 125/75 (!) 125/91   Pulse: 77 61 71   Resp: 18 18 18    Temp:  97.9 F (36.6 C)    TempSrc:  Oral    SpO2: 95% 98%  98%  Weight:      Height:        Intake/Output Summary (Last 24 hours) at 07/25/2020 0924 Last data filed at 07/25/2020 0700 Gross per 24 hour  Intake 4561.91 ml  Output 1864 ml  Net 2697.91 ml     Recent Labs    07/25/20 0141 07/25/20 0430  WBC 12.8* 14.2*  HGB 9.3* 9.3*  HCT 28.0* 28.3*  PLT 224 230    Blood type: --/--/O POS (04/25 2150) Rubella: Immune (11/04 0000)    Physical Exam:  General: alert, cooperative and no distress CV: Regular rate and rhythm Resp: clear Abdomen: soft, nontender, normal bowel sounds Incision: pressure dressing removed, serus drainage on honeycomb. Honeycomb to be replaced Perineum:  Uterine Fundus: firm, below umbilicus, nontender Lochia: appropriate Ext: neg for pain, tenderness, and cords, +1 non-pitting edema present   Assessment/Plan: 25 y.o.   POD# 1. [id number]                  Active Problems:   Gestational hypertension   Neurological symptoms   Severely increased blood pressure and edema during pregnancy   Encounter for induction of labor   Routine post-op PP care          Advance diet as tolerated Advised warm fluids and ambulation to improve GI motility Breastfeeding support D/C Magnesium after 24 hrs @ [place], MSN, CNM 07/25/2020,  9:24 AM

## 2020-07-25 NOTE — Plan of Care (Signed)

## 2020-07-25 NOTE — Anesthesia Postprocedure Evaluation (Signed)
Anesthesia Post Note  Patient: Kimberly Ponce  Procedure(s) Performed: CESAREAN SECTION     Patient location during evaluation: OB High Risk Anesthesia Type: Epidural Level of consciousness: awake and alert Pain management: pain level controlled Respiratory status: spontaneous breathing Cardiovascular status: stable Postop Assessment: no headache, adequate PO intake, able to ambulate, patient able to bend at knees, no backache, epidural receding and no apparent nausea or vomiting Anesthetic complications: no   No complications documented.  Last Vitals:  Vitals:   07/25/20 0700 07/25/20 0735  BP: (!) 106/53 125/75  Pulse: 77 61  Resp: 18 18  Temp:  36.6 C  SpO2: 95% 98%    Last Pain:  Vitals:   07/25/20 0735  TempSrc: Oral  PainSc:    Pain Goal:                   Ailene Ards

## 2020-07-25 NOTE — Anesthesia Postprocedure Evaluation (Signed)
Anesthesia Post Note  Patient: Kimberly Ponce  Procedure(s) Performed: [place]     Patient location during evaluation: Mother Baby Anesthesia Type: Epidural Level of consciousness: oriented and awake and alert Pain management: pain level controlled Vital Signs Assessment: post-procedure vital signs reviewed and stable Respiratory status: spontaneous breathing and respiratory function stable Cardiovascular status: blood pressure returned to baseline and stable Postop Assessment: no headache, no backache, no apparent nausea or vomiting and able to ambulate Anesthetic complications: no   No complications documented.  Last Vitals:  Vitals:   07/25/20 0137 07/25/20 0222  BP:  126/80  Pulse: 65   Resp: 12 16  Temp:  37 C  SpO2: 98% 98%    Last Pain:  Vitals:   07/25/20 0222  TempSrc: Oral  PainSc:    Pain Goal:    LLE Motor Response: Purposeful movement (07/25/20 0137) LLE Sensation: Tingling,Increased (07/25/20 0137) RLE Motor Response: Purposeful movement (07/25/20 0137) RLE Sensation: Tingling,Increased (07/25/20 9563)     Epidural/Spinal Function Cutaneous sensation: Tingles (07/25/20 0137), Patient able to flex knees: Yes (07/25/20 0137), Patient able to lift hips off bed: Yes (07/25/20 0137), Back pain beyond tenderness at insertion site: No (07/25/20 0137), Progressively worsening motor and/or sensory loss: No (07/25/20 0137), Bowel and/or bladder incontinence post epidural: No (07/25/20 0137)  Barnet Glasgow

## 2020-07-25 NOTE — Op Note (Signed)
Pre Op Dx:   1. Single live IUP at [redacted]w[redacted]d 2. Gestational hypertension with severe features (severe range blood pressures) 3. Non-reassuring fetal heart tracing 4. Failed induction of labor Post Op Dx:  Same as pre-op Procedure:  Low Transverse Cesarean Section  Surgeon:  Dr. Drema Dallas Assistants:  Noralyn Pick, CNM Anesthesia:  Epidural  EBL:  764cc  IVF:  1200cc UOP:  50cc  Drains:  Foley catheter  Specimen removed:  Placenta - sent to pathology  Unable to collect cord gases Device(s) implanted:  None Case Type:  Clean-contaminated Findings: Normal-appearing uterus, bilateral fallopian tubes and ovaries. Fetus in cephalic position - transverse head position. Clear amniotic fluid. Nuchal cord x 1 noted. Complications: None Indications:  25 y.o. G1 at [redacted]w[redacted]d undergoing induction of labor for gestational hypertension with severe range blood pressures with non-reassuring fetal heart tracing (recurrent late decelerations, variables, minimal variability) and failed induction of labor (no progression past 5cm with greater than 12 hours of ruptured membranes). Procedure:  After informed consent was obtained, the patient was brought to the operating room. The patient was positioned in dorsal supine position with a leftward tilt and was prepped and draped in sterile fashion.  A preoperative time-out was performed.  Following confirmation of adequate anesthesia, the abdomen was entered in layers through a pfannenstiel incision and an Alexis retractor was placed.  A low transverse hysterotomy was created sharply to the level of the membranes, then extended bluntly.  The fetus was delivered from cephalic presentation onto the field.  Bulb suctioning was performed.  The cord was doubly clamped and cut after a 60 second pause.  The newborn was passed to the warmer.  The placenta was delivered.  The uterus was swept free of clots and debris and closed in a running locked fashion with 0-Monocryl. The  uterus was closed in a second imbricating layer using 0-Monocryl.  Hemostasis was verified.  The abdomen was irrigated with warmed saline and cleared of clots.  The peritoneum was closed in a running fashion with 2-0 Vicryl.  Subfascial spaces were inspected and hemostasis assured.  The fascia was closed in a running fashion with 0-PDS.  The subcutaneous tissues were irrigated and hemostasis assured.  The subcutaneous tissues were closed with 3-0 Monocryl in a running fashion.  The skin was closed with 4-0 Vicryl.  A sterile bandage was applied.  The patient was transferred to PACU.  All needle, sponge, and instrument counts were correct at the end of the case.   Disposition:  PACU  I performed the procedure and the assistant was needed due to the complexity of the anatomy.  Drema Dallas, DO

## 2020-07-25 NOTE — Transfer of Care (Signed)
Immediate Anesthesia Transfer of Care Note  Patient: Kimberly Ponce  Procedure(s) Performed: CESAREAN SECTION  Patient Location: PACU  Anesthesia Type:Regional  Level of Consciousness: awake, alert  and oriented  Airway & Oxygen Therapy: Patient Spontanous Breathing  Post-op Assessment: Report given to RN and Post -op Vital signs reviewed and stable  Post vital signs: Reviewed and stable  Last Vitals:  Vitals Value Taken Time  BP 127/81 07/25/20 0050  Temp    Pulse 76 07/25/20 0053  Resp 23 07/25/20 0053  SpO2 100 % 07/25/20 0053  Vitals shown include unvalidated device data.  Last Pain:  Vitals:   07/24/20 1924  TempSrc: Oral  PainSc: 0-No pain         Complications: No complications documented.

## 2020-07-25 NOTE — Lactation Note (Signed)
This note was copied from a baby's chart. Lactation Consultation Note  Patient Name: Kimberly Ponce [id number] Date: 07/25/2020 Reason for consult: Follow-up assessment;Primapara;1st time breastfeeding;Late-preterm 34-36.6wks;Infant < 6lbs Age:27 hours  Follow up with 31 hours old LPT infant <6lbs at the time of this visit. Mother is breastfeeding, cradle position to left breast upon arrival. Noted suboptimal position and alignment, lack of support pillows, suckling/swallowing and a shallow latch. Demonstrated proper alignment and better support. Transitioned over a modified cradle to provide neck extension and back support. Showed breast compression to keep infant awake at breast.   Mother is using formula to supplement after breastfeeding due to SGA. Encouraged mother to continue pumping.   Feeding plan:  1-Skin to skin 2-Aim for a deep, comfortable latch 3-Breastfeeding on demand or 8-12 times in 24h period. 4-Keep infant awake during breastfeeding session: massaging breast, infant's hand/shoulder/feet 5-Pump and supplement following guidelines, paced bottle feeding and fullness cues.  6-Preserve infant energy limiting feeding sessions to 30 min max.  7-Monitor voids and stools as signs good intake.  8-Encouraged maternal rest, hydration and food intake.  9-Contact LC as needed for feeds/support/concerns/questions   All questions answered at this time.   Maternal Data Has patient been taught Hand Expression?: Yes  Feeding Mother's Current Feeding Choice: Breast Milk and Formula  LATCH Score Latch: Grasps breast easily, tongue down, lips flanged, rhythmical sucking.  Audible Swallowing: Spontaneous and intermittent  Type of Nipple: Everted at rest and after stimulation (short shafted)  Comfort (Breast/Nipple): Soft / non-tender  Hold (Positioning): Assistance needed to correctly position infant at breast and maintain latch.  LATCH Score: 9   Lactation Tools  Discussed/Used Tools: Pump Breast pump type: Double-Electric Breast Pump Reason for Pumping: stimulation and supplementation Pumping frequency: Q3  Interventions Interventions: Breast feeding basics reviewed;Assisted with latch;Skin to skin;Breast massage;Hand express;Adjust position;Position options;Expressed milk;DEBP;Education  Discharge    Consult Status Consult Status: Follow-up Date: 07/26/20 Follow-up type: In-patient    Jacquelyn Antony A Higuera Ancidey 07/25/2020, 9:55 PM

## 2020-07-25 NOTE — Lactation Note (Signed)
Lactation Consultation Note Patient Name: Kimberly Ponce LOVFI'E Date: 07/25/2020 Reason for consult: Initial assessment;Primapara;Infant < 6lbs;Late-preterm 34-36.6wks Age:25 y.o.  Maternal Data Has patient been taught Hand Expression?: Yes Does the patient have breastfeeding experience prior to this delivery?: No  Feeding    LATCH Score       Type of Nipple: Everted at rest and after stimulation  Comfort (Breast/Nipple): Soft / non-tender  Hold (Positioning): Assistance needed to correctly position infant at breast and maintain latch.      Lactation Tools Discussed/Used Tools: Pump Breast pump type: Double-Electric Breast Pump Pump Education: Setup, frequency, and cleaning;Milk Storage Reason for Pumping: LPI/less than 5 lbs Pumping frequency: Q 3 hrs  Interventions Interventions: Breast feeding basics reviewed;Support pillows;Assisted with latch;Position options;Skin to skin;Breast massage;Expressed milk;Hand express;Pre-pump if needed;Reverse pressure;Hand pump;DEBP;Breast compression;Adjust position  Discharge Pump: DEBP WIC Program: Yes  Consult Status Consult Status: Follow-up Date: 07/25/20 Follow-up type: In-patient    Theodoro Kalata 07/25/2020, 4:56 AM

## 2020-07-25 NOTE — Lactation Note (Signed)
This note was copied from a baby's chart. Lactation Consultation Note Baby 4 hrs old at time of consult. Baby sleeping. Mom wants to pump. Mom shown how to use DEBP & how to disassemble, clean, & reassemble parts. Mom knows to pump q3h for 15-20 min. Mom encouraged to feed baby 8-12 times/24 hours and with feeding cues.  LPI information sheet given and reviewed. Discussed supplementing according to hours of age.  Mom stated that she tried to BF at 03:30 but the baby wasn't interested. She got him to take 10 ml of formula. Baby sleeping.  Hand expression taught. Collected 3 ml colostrum. Put in bottle nipple and baby took it but it took some time d/t tongue thrusting and chewing on nipple.  Mom has [employer]. A referral made for mom to get DEBP. Newborn feeding habits, STS, importance of I&O, breast massage, supply and demand dicussed.  Mom wants LC to come back at 06:30 to try to latch baby.  Lactation brochure given.  Patient Name: Kimberly Ponce [id number] Date: 07/25/2020 Reason for consult: Initial assessment;Primapara;Infant < 6lbs;Late-preterm 34-36.6wks Age:31 hours  Maternal Data Does the patient have breastfeeding experience prior to this delivery?: No  Feeding Nipple Type: Slow - flow  LATCH Score Latch: Too sleepy or reluctant, no latch achieved, no sucking elicited.  Audible Swallowing: None  Type of Nipple: Everted at rest and after stimulation  Comfort (Breast/Nipple): Soft / non-tender  Hold (Positioning): Full assist, staff holds infant at breast  LATCH Score: 4   Lactation Tools Discussed/Used Tools: Pump Breast pump type: Double-Electric Breast Pump Pump Education: Setup, frequency, and cleaning;Milk Storage Reason for Pumping: LPI Pumping frequency: Q 3  Interventions Interventions: Support pillows;Assisted with latch;Position options;Skin to skin;Expressed milk;Breast massage;Hand express;Pre-pump if needed;DEBP;Breast compression;Adjust  position  Discharge Pump: DEBP WIC Program: Yes  Consult Status Consult Status: Follow-up Date: 07/25/20 Follow-up type: In-patient    Inri Sobieski, Elta Gu[place] 07/25/2020, 5:02 AM

## 2020-07-25 NOTE — Lactation Note (Signed)
This note was copied from a baby's chart. Lactation Consultation Note Called to PACU to assist in maintaining latch for 36 wks. Gestation baby. Baby BF great. Mom in laid back position. Baby needed support while feeding. Appeared to have a good latch. Mom has compressible breast tissue, colostrum noted. Basic BF tips discussed while holding baby on the breast during feeding.   Patient Name: Kimberly Ponce [id number] Date: 07/25/2020 Reason for consult: Initial assessment;Late-preterm 34-36.6wks Age:81 hours  Maternal Data    Feeding    LATCH Score Latch: Grasps breast easily, tongue down, lips flanged, rhythmical sucking.  Audible Swallowing: A few with stimulation  Type of Nipple: Everted at rest and after stimulation  Comfort (Breast/Nipple): Soft / non-tender  Hold (Positioning): Full assist, staff holds infant at breast  LATCH Score: 7   Lactation Tools Discussed/Used    Interventions Interventions: Assisted with latch;Skin to skin;Breast massage;Hand express;Adjust position;Breast compression  Discharge    Consult Status Consult Status: Follow-up Date: 07/25/20 Follow-up type: In-patient    Theodoro Kalata 07/25/2020, 1:33 AM

## 2020-07-26 DIAGNOSIS — O1493 Unspecified pre-eclampsia, third trimester: Secondary | ICD-10-CM | POA: Diagnosis not present

## 2020-07-26 LAB — SURGICAL PATHOLOGY

## 2020-07-26 MED ORDER — CALCIUM CARBONATE ANTACID 500 MG PO CHEW
1.0000 | CHEWABLE_TABLET | Freq: Four times a day (QID) | ORAL | Status: DC | PRN
  Administered 2020-07-26: 200 mg via ORAL
  Filled 2020-07-26: qty 1

## 2020-07-26 NOTE — Progress Notes (Signed)
Subjective: POD# 3 Primary Cesarean Section Information for the patient's newborn:  Naoma, Boxell [[id number]]  female    5 Name Kross Circumcision wants in pt. Baby on bili lights currently.  Reports feeling "Really good. It wasn't that bad" Feeding: bottle, breast Reports tolerating PO and denies N/V, foley removed, ambulating and urinating w/o difficulty  Pain controlled with acetaminophen Denies HA/SOB/dizziness  Flatus present Vaginal bleeding is normal, no clots     Objective:  VS:  Vitals:   07/26/20 0145 07/26/20 0300 07/26/20 0633 07/26/20 0723  BP:   (!) 141/89 (!) 134/93  Pulse:   74 88  Resp: 18 16 17 18   Temp:   (!) 97.4 F (36.3 C) (!) 97.4 F (36.3 C)  TempSrc:   Oral Oral  SpO2:   97% 97%  Weight:      Height:        Intake/Output Summary (Last 24 hours) at 07/26/2020 0856 Last data filed at 07/26/2020 6256 Gross per 24 hour  Intake 3261.68 ml  Output 2875 ml  Net 386.68 ml     Recent Labs    07/25/20 0141 07/25/20 0430  WBC 12.8* 14.2*  HGB 9.3* 9.3*  HCT 28.0* 28.3*  PLT 224 230    Blood type: --/--/O POS (04/25 2150) Rubella: Immune (11/04 0000)    Physical Exam:  General: alert, cooperative and no distress CV: Regular rate and rhythm Resp: clear Abdomen: soft, nontender, normal bowel sounds Incision: dry, intact and old bloody drainage present Perineum:  Uterine Fundus: firm, below umbilicus, nontender Lochia: minimal Ext: extremities normal, atraumatic, no cyanosis or edema   Assessment/Plan: 25 y.o.   POD# 2. [id number]                  Active Problems:   Gestational hypertension   Neurological symptoms   Severely increased blood pressure and edema during pregnancy   Encounter for induction of labor   Routine post-op PP care          Advance diet as tolerated Advised warm fluids and ambulation to improve GI motility Encourage rest when baby rests Breastfeeding support Anticipate D/C 07/27/20 Dr Charlesetta Garibaldi  updated on pt status and [place], MSN, CNM 07/26/2020, 8:56 AM

## 2020-07-26 NOTE — Lactation Note (Signed)
This note was copied from a baby's chart. Lactation Consultation Note  Patient Name: Boy Meranda Carolin Quang [id number] Date: 07/26/2020 Reason for consult: Follow-up assessment;1st time breastfeeding;Primapara;Late-preterm 34-36.6wks Age:25 years  Follow up visit to 16 hours old LPT infant with 4.93% weight loss at the time of visit. Mother reports collecting 15 mL per pumping session. Mother states infant is feeding well ~63minutes at breast. Mother denies pain or discomfort with latch.  Mother continues supplementing with ~103mL of 22cal/oz formula when EBM is not available. Infant has been having good voids and stools.    Feeding plan:  1-Skin to skin 2-Aim for a deep, comfortable latch 3-Breastfeeding on demand or 8-12 times in 24h period. 4-Keep infant awake during breastfeeding session: massaging breast, infant's hand/shoulder/feet 5-Pump and supplement following guidelines, paced bottle feeding and fullness cues.  6-Preserve infant energy limiting feeding sessions to 30 min max.  7-Monitor voids and stools as signs good intake.  8-Encouraged maternal rest, hydration and food intake.  9-Contact LC as needed for feeds/support/concerns/questions   All questions answered at this time.   Feeding Mother's Current Feeding Choice: Breast Milk and Formula Nipple Type: Nfant Slow Flow (purple)  Lactation Tools Discussed/Used Tools: Pump;Flanges Breast pump type: Double-Electric Breast Pump Reason for Pumping: LPTI Pumping frequency: Q3 Pumped volume: 15 mL  Interventions Interventions: Breast feeding basics reviewed;Education;DEBP;Expressed milk  Consult Status Consult Status: Follow-up Date: 07/27/20 Follow-up type: In-patient    Rasheena Talmadge A Higuera Ancidey 07/26/2020, 4:50 PM

## 2020-07-27 ENCOUNTER — Encounter (HOSPITAL_COMMUNITY): Payer: Self-pay | Admitting: Obstetrics and Gynecology

## 2020-07-27 MED ORDER — NIFEDIPINE ER OSMOTIC RELEASE 30 MG PO TB24
30.0000 mg | ORAL_TABLET | Freq: Every day | ORAL | Status: DC
  Administered 2020-07-27 – 2020-07-28 (×2): 30 mg via ORAL
  Filled 2020-07-27 (×2): qty 1

## 2020-07-27 NOTE — Progress Notes (Addendum)
Subjective: POD# 3 Information for the patient's newborn:  Kimberly Ponce, Kimberly Ponce [[id number]]  female     Circumcision deferred for phototherapy  Reports feeling good, denies HA, visual changes, and RUG pain. BP remains labile. Discussed oral antihypertensives and monitoring in-patient overnight. Pt agrees. Infant to remain in-patient for phototherapy.  Vaginal bleeding is normal, no clots     Objective:  VS:  Vitals:   07/26/20 2351 07/27/20 0426 07/27/20 0817 07/27/20 1243  BP: 136/72 (!) 141/85 138/88 118/76  Pulse: 88 77 83 70  Resp: 18 18 18 18   Temp: 97.6 F (36.4 C) 98.6 F (37 C) 97.9 F (36.6 C) 98.5 F (36.9 C)  TempSrc: Oral Oral Oral Oral  SpO2: 100% 100% 100% 100%  Weight:      Height:       No intake or output data in the 24 hours ending 07/27/20 1315   Recent Labs    07/25/20 0141 07/25/20 0430  WBC 12.8* 14.2*  HGB 9.3* 9.3*  HCT 28.0* 28.3*  PLT 224 230    Blood type: --/--/O POS (04/25 2150) Rubella: Immune (11/04 0000)    Physical Exam:  General: alert, cooperative and no distress CV: Regular rate and rhythm Resp: clear Abdomen: soft, nontender, normal bowel sounds Incision: clean, dry and intact Uterine Fundus: firm, below umbilicus, nontender Lochia: appropriate Ext: 1+ non-pitting edema, neg for pain, tenderness, or cords   Assessment/Plan: 25 y.o.   POD# 4. G1P0101                  GHTN with severe range BP S/P Magnesium sulfate in the postpartum period Labile BP  Start Procardia XL 30 mg PO daily Monitor overnight  Anticipate D/C 07/28/20 Plan developed w/ Dr. Etheleen Nicks, MSN, CNM 07/27/2020, 1:15 PM

## 2020-07-27 NOTE — Lactation Note (Signed)
This note was copied from a baby's chart. Lactation Consultation Note Mom continues to feed baby according to late preterm protocol. He is latching some and she is also pumping and bottle feeding. Mom's breasts are filling today without signs of engorgement. We reviewed the need for frequent milk removal today to avoid engorgement. Mom is aware that [facility] services are available prn.  Patient Name: Kimberly Ponce Date: 07/27/2020 Reason for consult: Late-preterm 34-36.6wks;Follow-up assessment Age:47 hours  Maternal Data    Feeding Mother's Current Feeding Choice: Breast Milk and Formula   Lactation Tools Discussed/Used Tools: Bottle Breast pump type: Double-Electric Breast Pump  Interventions Interventions: Education;Breast feeding basics reviewed;Expressed milk;Hand express;Ice  Discharge Discharge Education: Engorgement and breast care  Consult Status Consult Status: Follow-up Follow-up type: [place], [place] IBCLC 07/27/2020, 10:37 AM

## 2020-07-28 DIAGNOSIS — Z419 Encounter for procedure for purposes other than remedying health state, unspecified: Secondary | ICD-10-CM | POA: Diagnosis not present

## 2020-07-28 MED ORDER — ACETAMINOPHEN 500 MG PO TABS: 1000.0000 mg | ORAL_TABLET | Freq: Four times a day (QID) | ORAL | 0 refills | Status: DC

## 2020-07-28 MED ORDER — NIFEDIPINE ER 30 MG PO TB24: 30.0000 mg | ORAL_TABLET | Freq: Every day | ORAL | 1 refills | Status: DC

## 2020-07-28 MED ORDER — OXYCODONE HCL 5 MG PO TABS: 5.0000 mg | ORAL_TABLET | Freq: Four times a day (QID) | ORAL | 0 refills | Status: AC | PRN

## 2020-07-28 MED ORDER — IBUPROFEN 600 MG PO TABS
600.0000 mg | ORAL_TABLET | Freq: Four times a day (QID) | ORAL | 0 refills | Status: DC
Start: 2020-07-28 — End: 2022-05-10

## 2020-07-28 NOTE — Discharge Summary (Addendum)
Postpartum Discharge Summary  Date of Service updated 07/28/20    Patient Name: Kimberly Ponce DOB: 01/31/1996 MRN: [id number]  Date of admission: 07/22/2020 Delivery date:07/24/2020  Delivering provider: Drema Dallas  Date of discharge: 07/28/2020  Admitting diagnosis: Gestational hypertension [O13.9] Intrauterine pregnancy: [redacted]w[redacted]d     Secondary diagnosis:  Active Problems:   Gestational hypertension   Neurological symptoms   Severely increased blood pressure and edema during pregnancy   Encounter for induction of labor   Status post primary low transverse cesarean section  Additional problems: none    Discharge diagnosis: Preterm Pregnancy Delivered and Gestational Hypertension                                              Post partum procedures:Magnesium sulfate Augmentation: Pitocin and Cytotec Complications: None  Hospital course: Induction of Labor With Cesarean Section   25 y.o. yo G1P0101 at [redacted]w[redacted]d was admitted to the hospital 07/22/2020 for induction of labor. Patient had a labor course significant for hypertension with neur logical symptoms. The patient went for cesarean section due to Non-Reassuring FHR. Delivery details are as follows: Membrane Rupture Time/Date: 8:00 AM ,07/24/2020   Delivery Method:C-Section, Low Transverse  Details of operation can be found in separate operative Note.  Patient had an uncomplicated postpartum course. She is ambulating, tolerating a regular diet, passing flatus, and urinating well.  Patient is discharged home in stable condition on 07/28/20.      Newborn Data: Birth date:07/24/2020  Birth time:11:48 PM  Gender:Female  Living status:Living  Apgars:8 ,9  Weight:2230 g                                 Magnesium Sulfate received: Yes: Seizure prophylaxis BMZ received: Yes Rhophylac:N/A MMR:N/A T-DaP:declined Flu: No Transfusion:No  Physical exam  Vitals:   07/27/20 2333 07/28/20 0413 07/28/20 0414 07/28/20 0825  BP: 137/80 (!)  157/97 (!) 143/96 130/69  Pulse: 86 68 70 72  Resp: $Remo'18  20 18  'wtCbu$ Temp: 97.9 F (36.6 C)  98.3 F (36.8 C) 97.9 F (36.6 C)  TempSrc: Oral  Oral Oral  SpO2: 100%  99% 100%  Weight:      Height:       General: alert, cooperative and no distress Lochia: appropriate Uterine Fundus: firm Incision: Healing well with no significant drainage, Dressing is clean, dry, and intact DVT Evaluation: No evidence of DVT seen on physical exam. No cords or calf tenderness. No significant calf/ankle edema. Labs: Lab Results  Component Value Date   WBC 14.2 (H) 07/25/2020   HGB 9.3 (L) 07/25/2020   HCT 28.3 (L) 07/25/2020   MCV 92.5 07/25/2020   PLT 230 07/25/2020   CMP Latest Ref Rng & Units 07/25/2020  Glucose 70 - 99 mg/dL 98  BUN 6 - 20 mg/dL 8  Creatinine 0.44 - 1.00 mg/dL 0.69  Sodium 135 - 145 mmol/L 133(L)  Potassium 3.5 - 5.1 mmol/L 3.7  Chloride 98 - 111 mmol/L 103  CO2 22 - 32 mmol/L 20(L)  Calcium 8.9 - 10.3 mg/dL 8.2(L)  Total Protein 6.5 - 8.1 g/dL 5.2(L)  Total Bilirubin 0.3 - 1.2 mg/dL 0.4  Alkaline Phos 38 - 126 U/L 82  AST 15 - 41 U/L 15  ALT 0 - 44 U/L 12   Edinburgh Score:  Edinburgh Postnatal Depression Scale Screening Tool 07/26/2020  I have been able to laugh and see the funny side of things. 0  I have looked forward with enjoyment to things. 0  I have blamed myself unnecessarily when things went wrong. 1  I have been anxious or worried for no good reason. 0  I have felt scared or panicky for no good reason. 0  Things have been getting on top of me. 1  I have been so unhappy that I have had difficulty sleeping. 0  I have felt sad or miserable. 0  I have been so unhappy that I have been crying. 0  The thought of harming myself has occurred to me. 0  Edinburgh Postnatal Depression Scale Total 2      After visit meds:  Allergies as of 07/28/2020      Reactions   Other Other (See Comments)   Dark chocolate: throat swells      Medication List    TAKE these  medications   acetaminophen 500 MG tablet Commonly known as: TYLENOL Take 2 tablets (1,000 mg total) by mouth every 6 (six) hours.   ibuprofen 600 MG tablet Commonly known as: ADVIL Take 1 tablet (600 mg total) by mouth every 6 (six) hours.   NIFEdipine 30 MG 24 hr tablet Commonly known as: ADALAT CC Take 1 tablet (30 mg total) by mouth daily. Start taking on: Jul 29, 2020   oxyCODONE 5 MG immediate release tablet Commonly known as: Oxy IR/ROXICODONE Take 1 tablet (5 mg total) by mouth every 6 (six) hours as needed for up to 3 days for moderate pain.   PrePLUS 27-1 MG Tabs Take 1 tablet by mouth daily.        Discharge home in stable condition Infant Feeding: Breast Infant Disposition:home with mother Discharge instruction: per After Visit Summary and Postpartum booklet. Activity: Advance as tolerated. Pelvic rest for 6 weeks.  Diet: low salt diet Anticipated Birth Control: Unsure Postpartum Appointment:6 weeks Additional Postpartum F/U: BP check 2-3 days Future Appointments:No future appointments. Follow up Visit:  [facility]. Schedule an appointment as soon as possible for a visit in 3 day(s).   Specialty: Obstetrics and Gynecology Why: Return to [facility] on Tuesday or Wednesday (5/3 or 5/4) for BP check and then in 6 weeks for regular postpartum visit. Contact information: [address] [place] [address] [phone]                  07/28/2020 Trigger Frasier B Terrin Imparato, CNM

## 2020-07-28 NOTE — Plan of Care (Signed)
  Problem: Education: Goal: Knowledge of condition will improve Outcome: Completed/Met Goal: Individualized Educational Video(s) Outcome: Completed/Met Goal: Individualized Newborn Educational Video(s) Outcome: Completed/Met   Problem: Activity: Goal: Will verbalize the importance of balancing activity with adequate rest periods Outcome: Completed/Met Goal: Ability to tolerate increased activity will improve Outcome: Completed/Met   Problem: Coping: Goal: Ability to identify and utilize available resources and services will improve Outcome: Completed/Met   Problem: Life Cycle: Goal: Chance of risk for complications during the postpartum period will decrease Outcome: Completed/Met   Problem: Role Relationship: Goal: Ability to demonstrate positive interaction with newborn will improve Outcome: Completed/Met   Problem: Skin Integrity: Goal: Demonstration of wound healing without infection will improve Outcome: Completed/Met   Problem: Education: Goal: Knowledge of disease or condition will improve Outcome: Completed/Met Goal: Knowledge of the prescribed therapeutic regimen will improve Outcome: Completed/Met   Problem: Fluid Volume: Goal: Peripheral tissue perfusion will improve Outcome: Completed/Met   Problem: Clinical Measurements: Goal: Complications related to disease process, condition or treatment will be avoided or minimized Outcome: Completed/Met

## 2020-07-28 NOTE — Lactation Note (Signed)
This note was copied from a baby's chart. Lactation Consultation Note Mom and baby to d/c today. Mom's milk is in and she is pumping approx. 79mls per session. Infant wt loss and output are wnl today. Mother denies engorgement today. She is mostly bottle feeding because of difficulty latching. She is aware that bf'ing will likely improve as infant ages and will continue to offer breast. Infant to f/u with peds tomorrow. Mom is aware of community resources.  Patient Name: Kimberly Ponce [id number] Date: 07/28/2020 Reason for consult: Follow-up assessment;Late-preterm 34-36.6wks Age:27 days   Feeding Mother's Current Feeding Choice: Breast Milk and Formula Nipple Type: Nfant Slow Flow (purple)  Interventions Interventions: Education;Breast feeding basics reviewed;Expressed milk  Discharge Discharge Education: Engorgement and breast care;Warning signs for feeding baby  Consult Status Consult Status: Follow-up Follow-up type: [place], [place] 07/28/2020, 10:15 AM

## 2020-08-28 DIAGNOSIS — Z419 Encounter for procedure for purposes other than remedying health state, unspecified: Secondary | ICD-10-CM | POA: Diagnosis not present

## 2020-09-02 DIAGNOSIS — Z20828 Contact with and (suspected) exposure to other viral communicable diseases: Secondary | ICD-10-CM | POA: Diagnosis not present

## 2020-09-04 DIAGNOSIS — Z7251 High risk heterosexual behavior: Secondary | ICD-10-CM | POA: Diagnosis not present

## 2020-09-04 DIAGNOSIS — Z3009 Encounter for other general counseling and advice on contraception: Secondary | ICD-10-CM | POA: Diagnosis not present

## 2020-09-05 DIAGNOSIS — Z20828 Contact with and (suspected) exposure to other viral communicable diseases: Secondary | ICD-10-CM | POA: Diagnosis not present

## 2020-09-12 DIAGNOSIS — Z20828 Contact with and (suspected) exposure to other viral communicable diseases: Secondary | ICD-10-CM | POA: Diagnosis not present

## 2020-09-27 DIAGNOSIS — Z419 Encounter for procedure for purposes other than remedying health state, unspecified: Secondary | ICD-10-CM | POA: Diagnosis not present

## 2020-10-28 DIAGNOSIS — Z419 Encounter for procedure for purposes other than remedying health state, unspecified: Secondary | ICD-10-CM | POA: Diagnosis not present

## 2020-11-28 DIAGNOSIS — Z419 Encounter for procedure for purposes other than remedying health state, unspecified: Secondary | ICD-10-CM | POA: Diagnosis not present

## 2020-12-28 DIAGNOSIS — Z419 Encounter for procedure for purposes other than remedying health state, unspecified: Secondary | ICD-10-CM | POA: Diagnosis not present

## 2021-01-28 DIAGNOSIS — Z419 Encounter for procedure for purposes other than remedying health state, unspecified: Secondary | ICD-10-CM | POA: Diagnosis not present

## 2021-02-27 DIAGNOSIS — Z419 Encounter for procedure for purposes other than remedying health state, unspecified: Secondary | ICD-10-CM | POA: Diagnosis not present

## 2021-03-30 DIAGNOSIS — Z419 Encounter for procedure for purposes other than remedying health state, unspecified: Secondary | ICD-10-CM | POA: Diagnosis not present

## 2021-04-30 DIAGNOSIS — Z419 Encounter for procedure for purposes other than remedying health state, unspecified: Secondary | ICD-10-CM | POA: Diagnosis not present

## 2021-05-13 DIAGNOSIS — M545 Low back pain, unspecified: Secondary | ICD-10-CM | POA: Insufficient documentation

## 2021-05-13 DIAGNOSIS — R0602 Shortness of breath: Secondary | ICD-10-CM | POA: Diagnosis not present

## 2021-05-13 DIAGNOSIS — L500 Allergic urticaria: Secondary | ICD-10-CM | POA: Diagnosis not present

## 2021-05-13 DIAGNOSIS — F419 Anxiety disorder, unspecified: Secondary | ICD-10-CM | POA: Diagnosis not present

## 2021-05-13 DIAGNOSIS — R110 Nausea: Secondary | ICD-10-CM | POA: Insufficient documentation

## 2021-05-13 DIAGNOSIS — T7840XA Allergy, unspecified, initial encounter: Secondary | ICD-10-CM | POA: Diagnosis not present

## 2021-05-13 DIAGNOSIS — R102 Pelvic and perineal pain: Secondary | ICD-10-CM | POA: Insufficient documentation

## 2021-05-13 DIAGNOSIS — Z5321 Procedure and treatment not carried out due to patient leaving prior to being seen by health care provider: Secondary | ICD-10-CM | POA: Insufficient documentation

## 2021-05-13 DIAGNOSIS — R109 Unspecified abdominal pain: Secondary | ICD-10-CM | POA: Insufficient documentation

## 2021-05-13 NOTE — ED Triage Notes (Addendum)
Pt BIB EMS with reports of allergic reaction, SHOB and hives since 2100. No hives noted. Pt took 50 mg Benadryl. 18 g left ac 100 mcg Fentanyl. Pt reports being on her period and complains of vaginal pain, abdominal pain, and lower back pain.

## 2021-05-14 ENCOUNTER — Emergency Department (HOSPITAL_COMMUNITY)
Admission: EM | Admit: 2021-05-14 | Discharge: 2021-05-14 | Disposition: A | Discharge status: Home / Self Care | Payer: Medicaid Other | Attending: Emergency Medicine | Admitting: Emergency Medicine

## 2021-05-14 ENCOUNTER — Encounter (HOSPITAL_COMMUNITY): Payer: Self-pay

## 2021-05-14 ENCOUNTER — Other Ambulatory Visit: Payer: Self-pay

## 2021-05-14 ENCOUNTER — Emergency Department (HOSPITAL_COMMUNITY): Payer: Medicaid Other

## 2021-05-14 LAB — CBC WITH DIFFERENTIAL/PLATELET
Abs Immature Granulocytes: 0.01 10*3/uL (ref 0.00–0.07)
Basophils Absolute: 0 10*3/uL (ref 0.0–0.1)
Basophils Relative: 0 %
Eosinophils Absolute: 0 10*3/uL (ref 0.0–0.5)
Eosinophils Relative: 1 %
HCT: 35.9 % — ABNORMAL LOW (ref 36.0–46.0)
Hemoglobin: 11.8 g/dL — ABNORMAL LOW (ref 12.0–15.0)
Immature Granulocytes: 0 %
Lymphocytes Relative: 51 %
Lymphs Abs: 3 10*3/uL (ref 0.7–4.0)
MCH: 30 pg (ref 26.0–34.0)
MCHC: 32.9 g/dL (ref 30.0–36.0)
MCV: 91.3 fL (ref 80.0–100.0)
Monocytes Absolute: 0.4 10*3/uL (ref 0.1–1.0)
Monocytes Relative: 7 %
Neutro Abs: 2.4 10*3/uL (ref 1.7–7.7)
Neutrophils Relative %: 41 %
Platelets: 315 10*3/uL (ref 150–400)
RBC: 3.93 MIL/uL (ref 3.87–5.11)
RDW: 12.8 % (ref 11.5–15.5)
WBC: 5.8 10*3/uL (ref 4.0–10.5)
nRBC: 0 % (ref 0.0–0.2)

## 2021-05-14 LAB — COMPREHENSIVE METABOLIC PANEL
ALT: 13 U/L (ref 0–44)
AST: 15 U/L (ref 15–41)
Albumin: 4 g/dL (ref 3.5–5.0)
Alkaline Phosphatase: 41 U/L (ref 38–126)
Anion gap: 8 (ref 5–15)
BUN: 17 mg/dL (ref 6–20)
CO2: 21 mmol/L — ABNORMAL LOW (ref 22–32)
Calcium: 9 mg/dL (ref 8.9–10.3)
Chloride: 106 mmol/L (ref 98–111)
Creatinine, Ser: 0.81 mg/dL (ref 0.44–1.00)
GFR, Estimated: >60 mL/min (ref >60)
Glucose, Bld: 141 mg/dL — ABNORMAL HIGH (ref 70–99)
Potassium: 3.2 mmol/L — ABNORMAL LOW (ref 3.5–5.1)
Sodium: 135 mmol/L (ref 135–145)
Total Bilirubin: 0.3 mg/dL (ref 0.3–1.2)
Total Protein: 7.3 g/dL (ref 6.5–8.1)

## 2021-05-14 LAB — LIPASE, BLOOD: Lipase: 29 U/L (ref 11–51)

## 2021-05-14 LAB — HCG, QUANTITATIVE, PREGNANCY: hCG, Beta Chain, Quant, S: <1 m[IU]/mL (ref <5)

## 2021-05-14 MED ORDER — IOHEXOL 300 MG/ML  SOLN: 100.0000 mL | Freq: Once | INTRAMUSCULAR | Status: DC | PRN

## 2021-05-14 MED ORDER — SODIUM CHLORIDE (PF) 0.9 % IJ SOLN
INTRAMUSCULAR | Status: AC
  Filled 2021-05-14: qty 50

## 2021-05-14 NOTE — ED Provider Triage Note (Signed)
Emergency Medicine Provider Triage Evaluation Note  Kimberly Ponce , a 26 y.o. female  was evaluated in triage.  Pt complains of allergic reaction with hives that took place around 9 PM while patient was at work.  She is unsure what she reacted to.  Only known allergy is dark chocolate.  Patient took 50 mg Benadryl p.o. with resolution of symptoms.  On arrival of EMS on scene patient was without any symptoms of allergic reaction.  She did however develop severe 10/10 abdominal pain radiating down both legs without associated flank pain, dysuria.  She endorses nausea but has not vomited.  She states she is on her menstrual cycle currently.  Denies fever.  Patient received 100 mcg of fentanyl and currently rates pain at 8/10.  Review of Systems  Positive: As above Negative: As above  Physical Exam  BP (!) 121/91    Pulse 71    Temp 98.6 F (37 C) (Oral)    Resp 15    SpO2 96%  Gen:   Awake, no distress   Resp:  Normal effort MSK:   Moves extremities without difficulty  Other:  Diffuse abdominal tenderness.  Medical Decision Making  Medically screening exam initiated at 12:02 AM.  Appropriate orders placed.  Barbi Barga was informed that the remainder of the evaluation will be completed by another provider, this initial triage assessment does not replace that evaluation, and the importance of remaining in the ED until their evaluation is complete.     Evlyn Courier, PA-C 05/14/21 0004

## 2021-05-14 NOTE — ED Notes (Signed)
Pt request to have iv pulled state she leaving due to wait time. RN aware

## 2021-05-28 DIAGNOSIS — Z419 Encounter for procedure for purposes other than remedying health state, unspecified: Secondary | ICD-10-CM | POA: Diagnosis not present

## 2021-06-28 DIAGNOSIS — Z419 Encounter for procedure for purposes other than remedying health state, unspecified: Secondary | ICD-10-CM | POA: Diagnosis not present

## 2021-07-17 DIAGNOSIS — R030 Elevated blood-pressure reading, without diagnosis of hypertension: Secondary | ICD-10-CM | POA: Diagnosis not present

## 2021-07-17 DIAGNOSIS — Z7901 Long term (current) use of anticoagulants: Secondary | ICD-10-CM | POA: Diagnosis not present

## 2021-07-17 DIAGNOSIS — Z114 Encounter for screening for human immunodeficiency virus [HIV]: Secondary | ICD-10-CM | POA: Diagnosis not present

## 2021-07-17 DIAGNOSIS — Z6837 Body mass index (BMI) 37.0-37.9, adult: Secondary | ICD-10-CM | POA: Diagnosis not present

## 2021-07-17 DIAGNOSIS — Z01818 Encounter for other preprocedural examination: Secondary | ICD-10-CM | POA: Diagnosis not present

## 2021-07-22 ENCOUNTER — Other Ambulatory Visit: Payer: Self-pay | Admitting: Family Medicine

## 2021-07-22 DIAGNOSIS — Z01818 Encounter for other preprocedural examination: Secondary | ICD-10-CM

## 2021-07-28 DIAGNOSIS — Z419 Encounter for procedure for purposes other than remedying health state, unspecified: Secondary | ICD-10-CM | POA: Diagnosis not present

## 2021-08-28 DIAGNOSIS — Z419 Encounter for procedure for purposes other than remedying health state, unspecified: Secondary | ICD-10-CM | POA: Diagnosis not present

## 2021-09-27 DIAGNOSIS — Z419 Encounter for procedure for purposes other than remedying health state, unspecified: Secondary | ICD-10-CM | POA: Diagnosis not present

## 2021-10-28 DIAGNOSIS — Z419 Encounter for procedure for purposes other than remedying health state, unspecified: Secondary | ICD-10-CM | POA: Diagnosis not present

## 2021-11-28 DIAGNOSIS — Z419 Encounter for procedure for purposes other than remedying health state, unspecified: Secondary | ICD-10-CM | POA: Diagnosis not present

## 2021-12-28 DIAGNOSIS — Z419 Encounter for procedure for purposes other than remedying health state, unspecified: Secondary | ICD-10-CM | POA: Diagnosis not present

## 2021-12-28 IMAGING — US US OB < 14 WEEKS - US OB TV
1 series · 15 of 28 positions shown · non-contrast
Comparison: None.

CLINICAL DATA: Initial evaluation for pelvic cramping, discharge.
Positive beta HCG.

EXAM:
OBSTETRIC <14 WK US AND TRANSVAGINAL OB US
TECHNIQUE: Both transabdominal and transvaginal ultrasound examinations were
performed for complete evaluation of the gestation as well as the
maternal uterus, adnexal regions, and pelvic cul-de-sac.
Transvaginal technique was performed to assess early pregnancy.

[Series 1: us ob < 14 weeks - us ob tv · 15 of 37 slices shown]
[im 1/37]
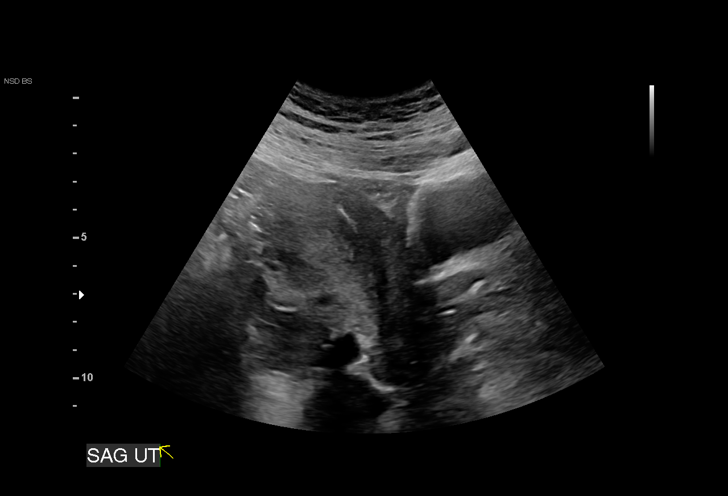
[im 3/37]
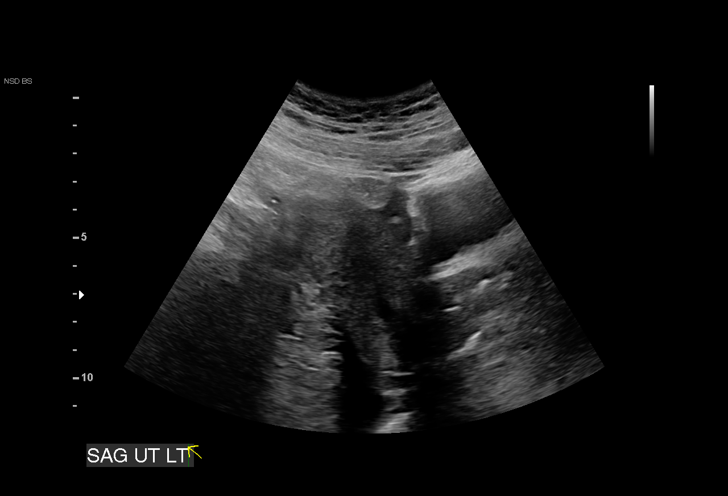
[im 6/37]
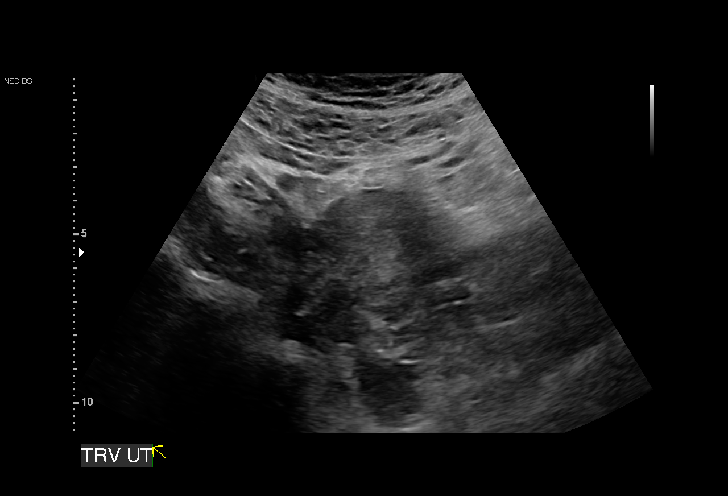
[im 9/37]
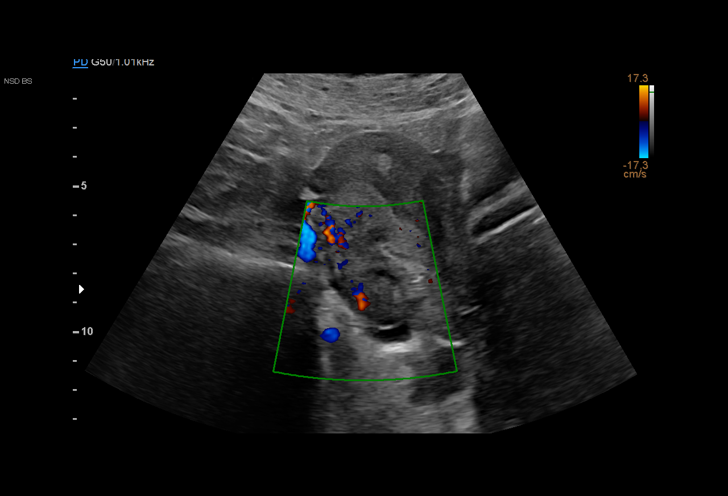
[im 11/37]
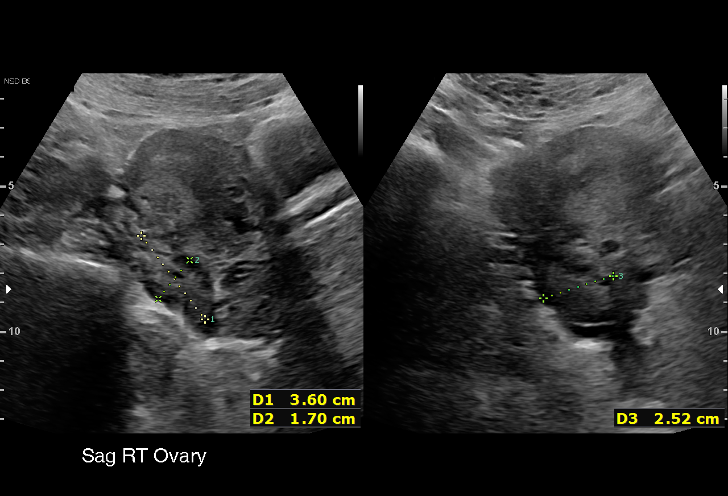
[im 14/37]
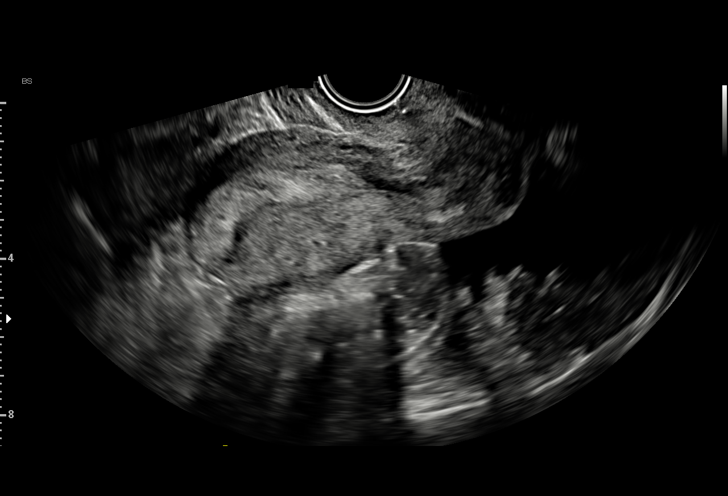
[im 17/37]
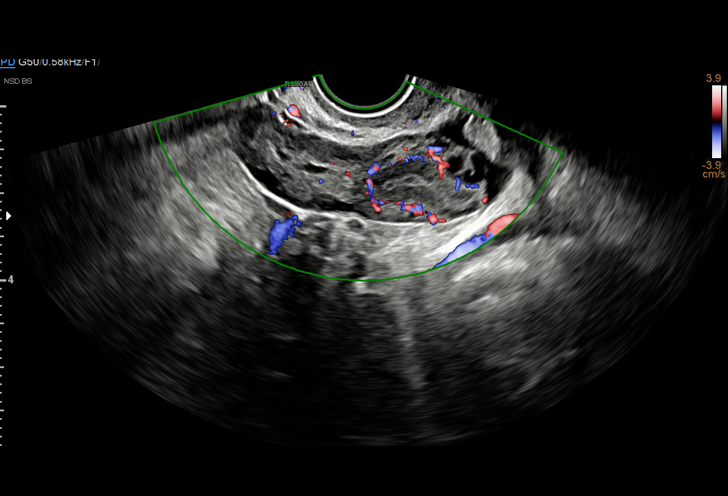
[im 19/37]
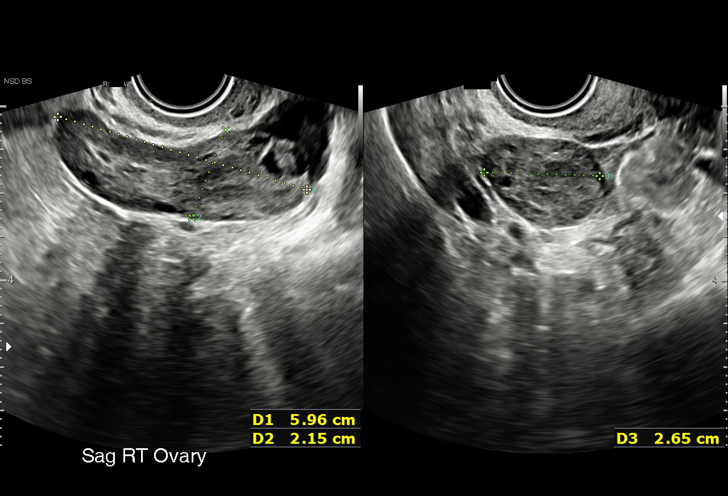
[im 21/37]
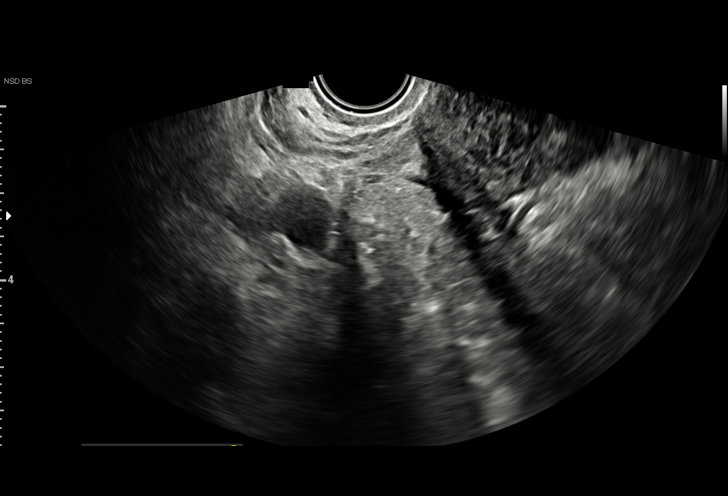
[im 23/37]
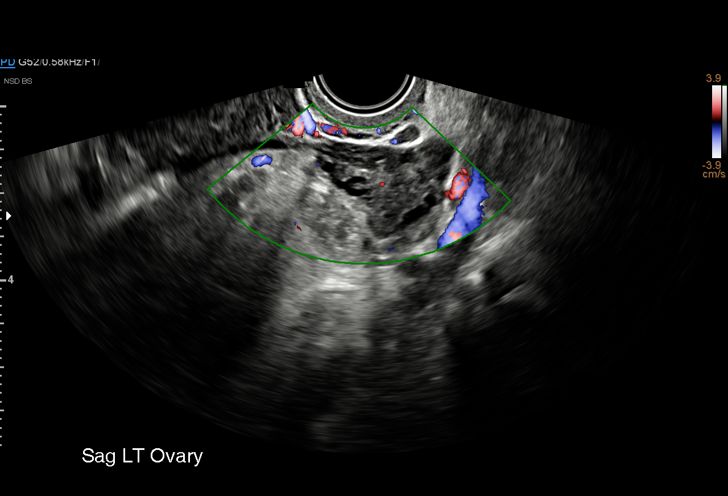
[im 26/37]
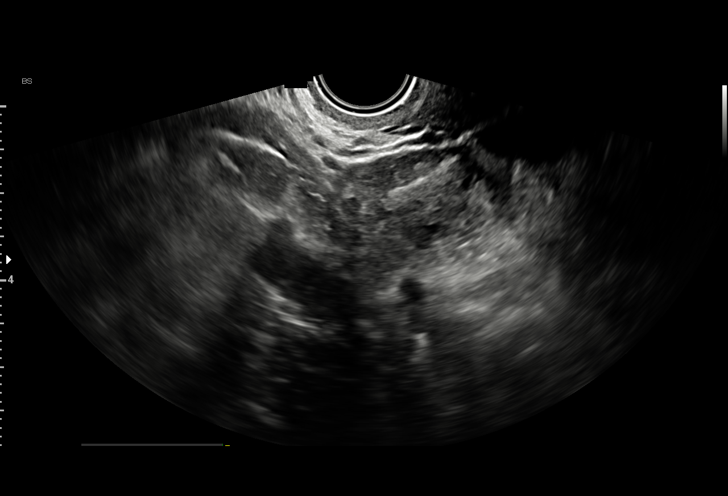
[im 29/37]
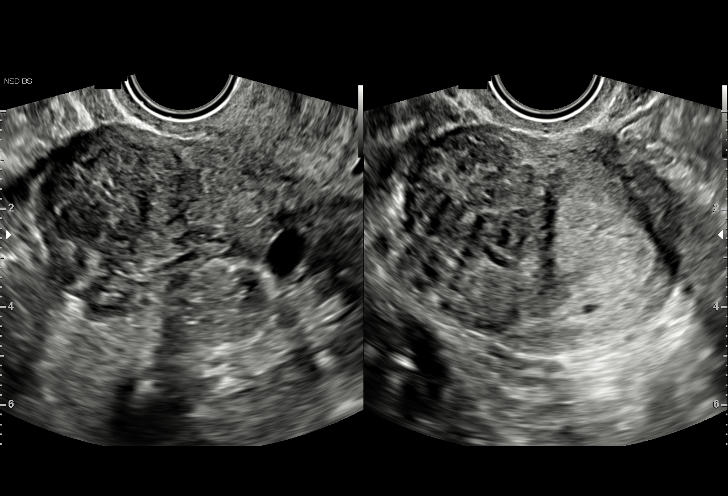
[im 31/37]
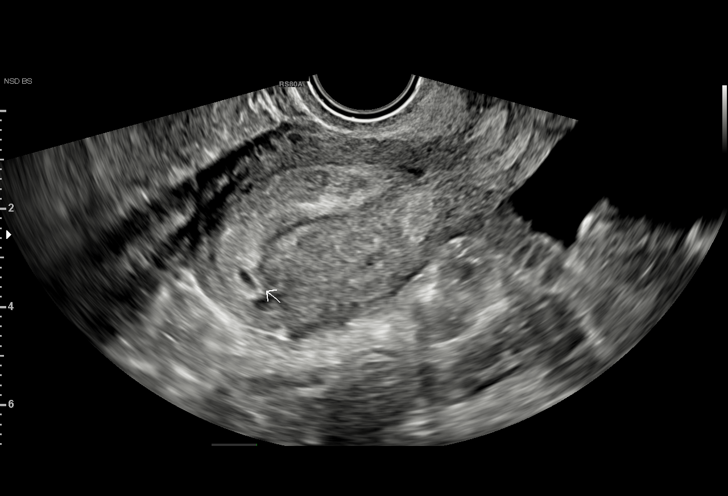
[im 34/37]
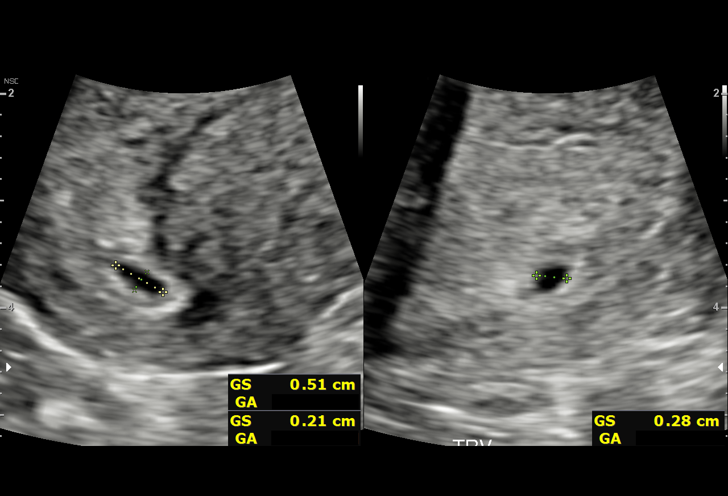
[im 37/37]
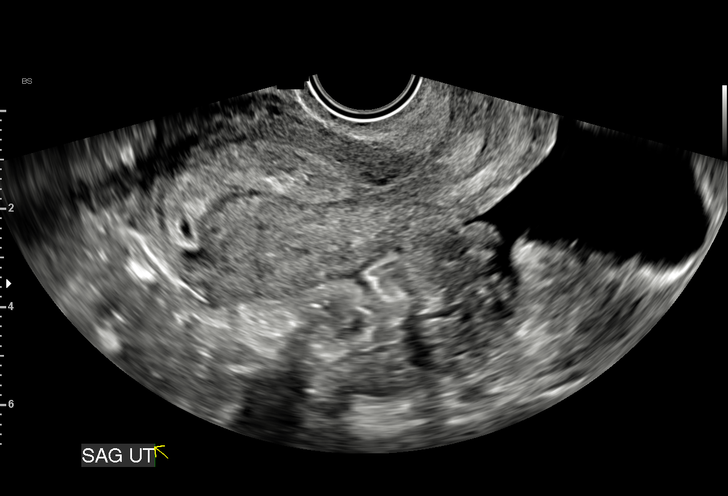

[15 of 28 positions shown; findings below may reference images not displayed]

FINDINGS: Intrauterine gestational sac: Probable early gestational sac.

Yolk sac:  Negative.

Embryo:  Negative.

Cardiac Activity: Negative.

MSD: 3.3 mm   5 w   0 d

Subchorionic hemorrhage:  None visualized.

Maternal uterus/adnexae: Ovaries within normal limits bilaterally.
Degenerating corpus luteal cyst noted within the right ovary.
Moderate volume free fluid present within the pelvis. No adnexal
mass.

Note made of a 3.1 x 2.9 x 3.0 cm subserosal fibroid at the right
uterine body.
IMPRESSION: 1. Probable early intrauterine gestational sac, but no yolk sac,
fetal pole, or cardiac activity yet visualized. Recommend follow-up
quantitative B-HCG levels and follow-up US in 14 days to confirm and
assess viability. This recommendation follows SRU consensus
guidelines: Diagnostic Criteria for Nonviable Pregnancy Early in the
First Trimester. N Engl J Med 3766; [DATE].
2. Degenerating right ovarian corpus luteal cyst with associated
moderate volume free fluid within the pelvis.
[DATE] cm fibroid at the right uterine body.

## 2022-01-06 IMAGING — US US OB TRANSVAGINAL
1 series · 15 of 25 positions shown · non-contrast
Comparison: 12/10/2019

CLINICAL DATA: Vaginal bleeding, abdominal cramping

EXAM:
TRANSVAGINAL OB ULTRASOUND
TECHNIQUE: Transvaginal ultrasound was performed for complete evaluation of the
gestation as well as the maternal uterus, adnexal regions, and
pelvic cul-de-sac.

[Series 1: us ob transvaginal · 15 of 25 slices shown]
[im 1/25]
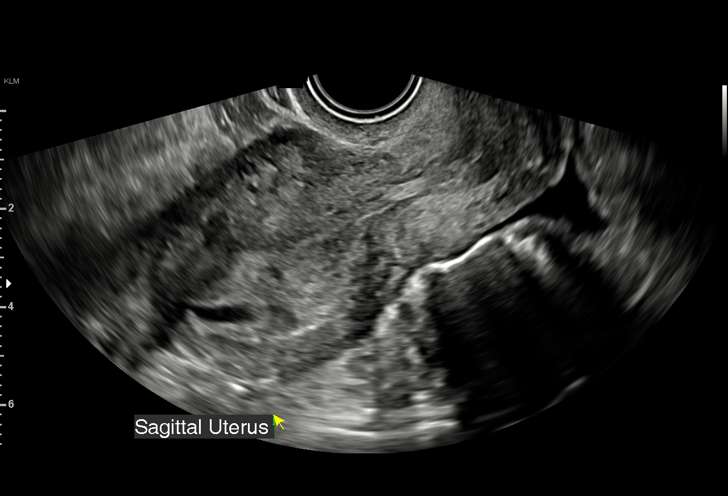
[im 3/25]
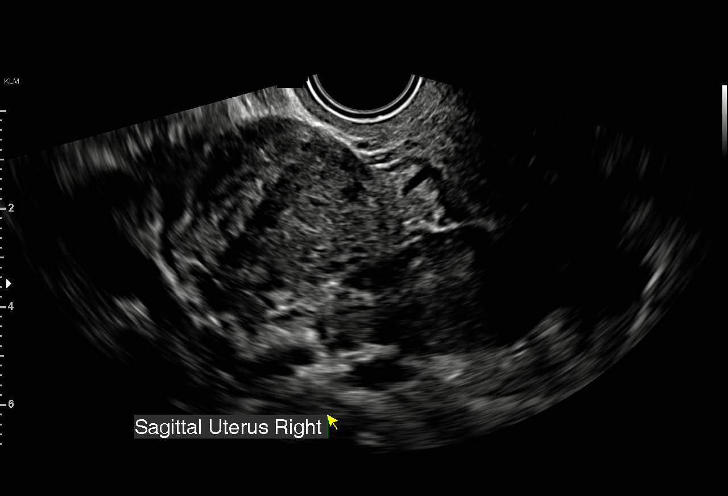
[im 5/25]
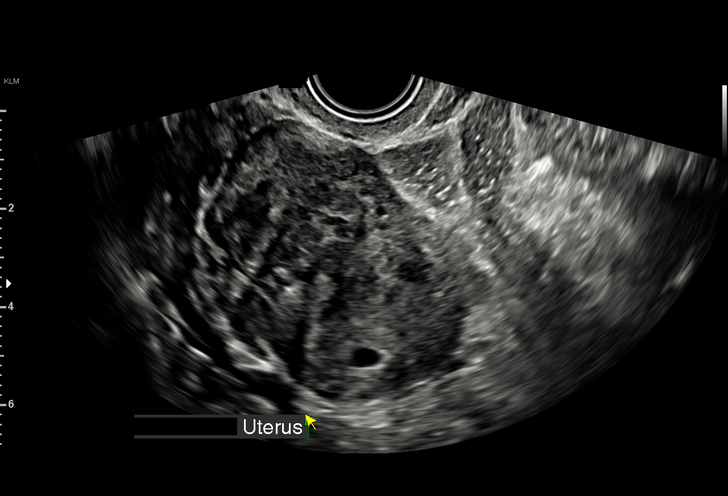
[im 6/25]
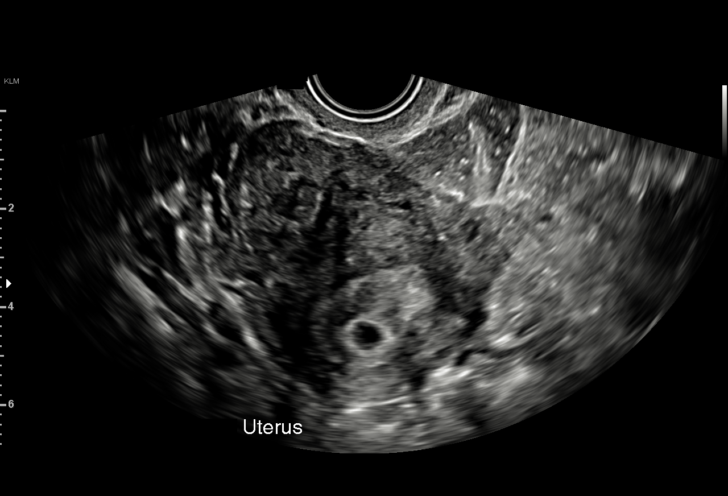
[im 8/25]
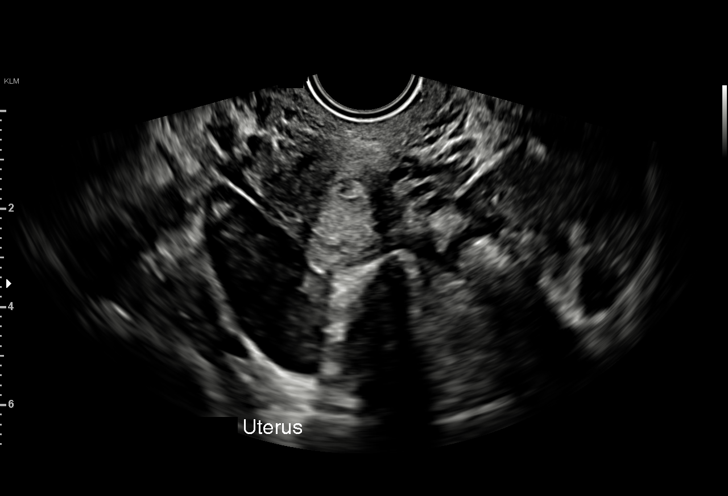
[im 10/25]
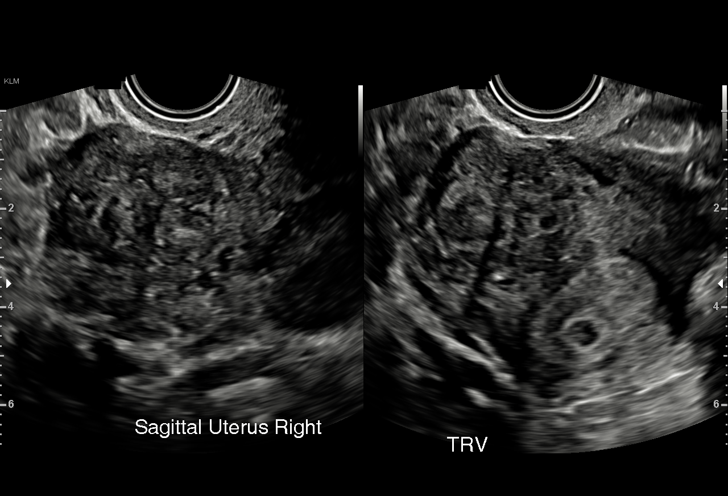
[im 11/25]
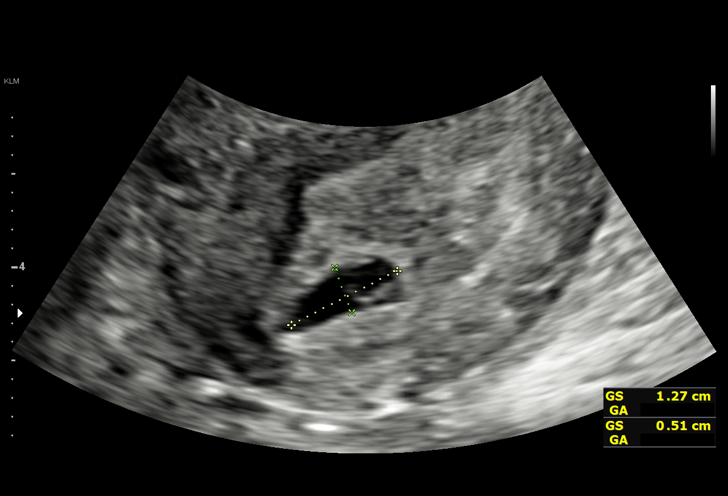
[im 13/25]
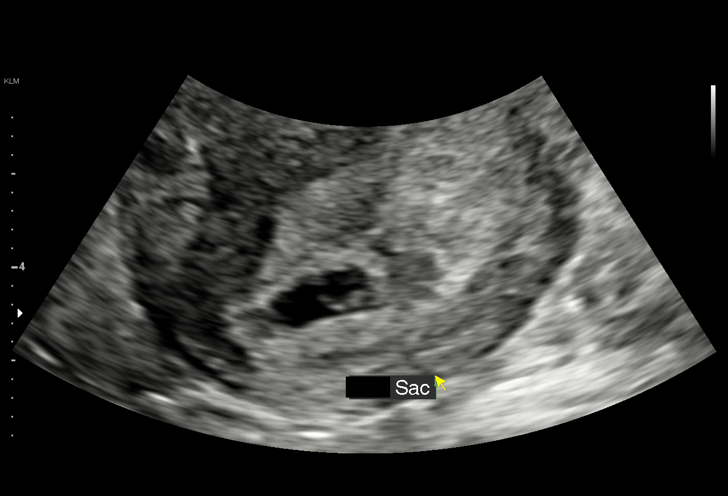
[im 15/25]
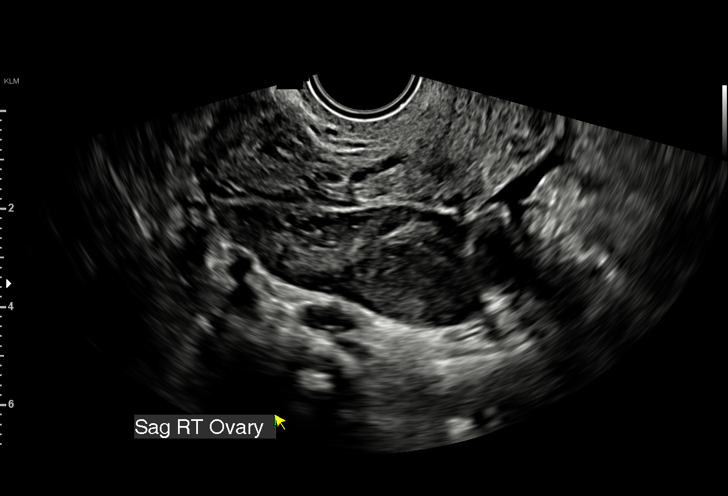
[im 16/25]
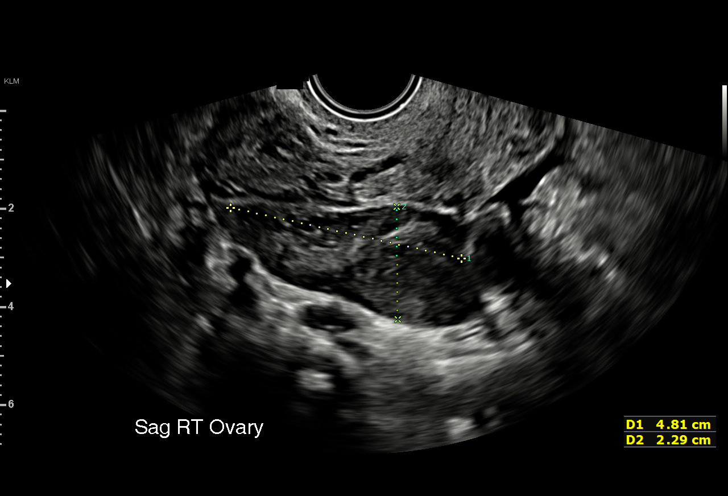
[im 18/25]
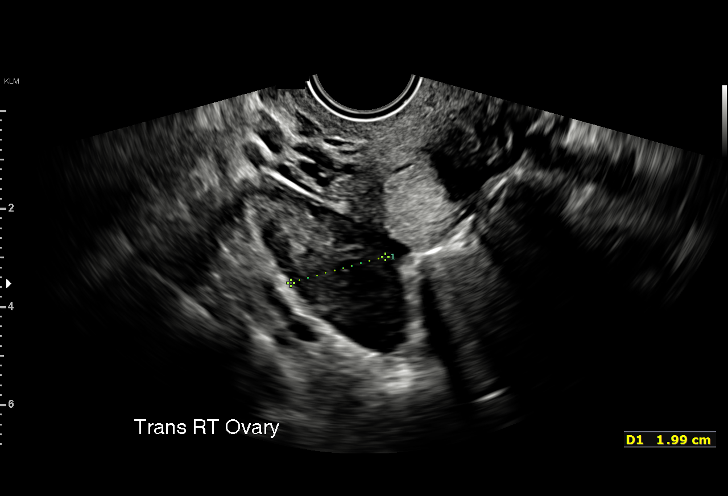
[im 20/25]
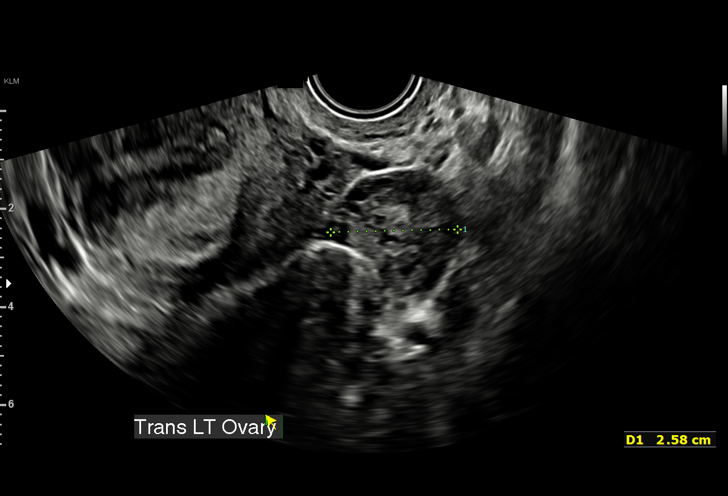
[im 21/25]
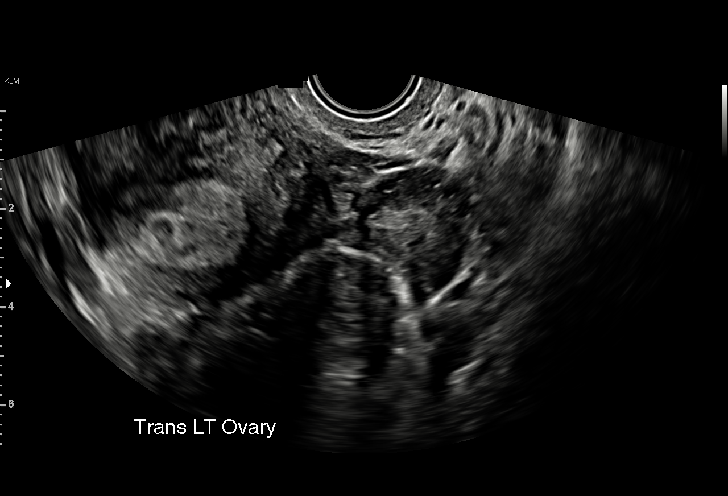
[im 23/25]
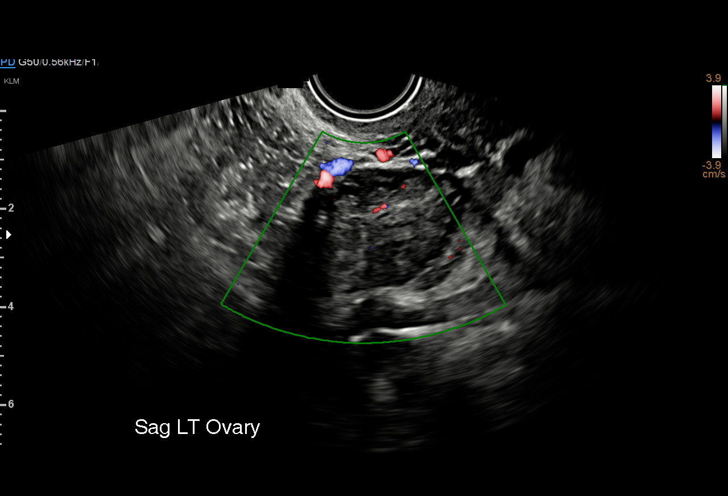
[im 25/25]
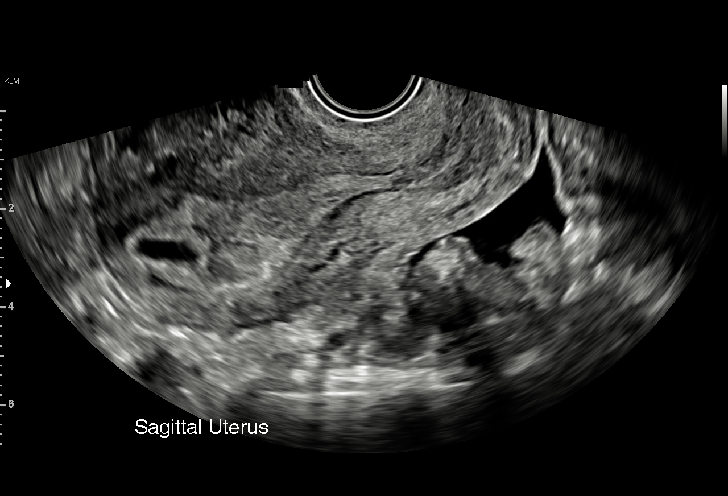

[15 of 25 positions shown; findings below may reference images not displayed]

FINDINGS: Intrauterine gestational sac: Single

Yolk sac:  Visualized.

Embryo:  Not Visualized.

Cardiac Activity: Not Visualized.

MSD: 8.1 mm   5 w   4 d

Subchorionic hemorrhage:  None visualized.

Maternal uterus/adnexae: Left ovary measures 3.8 x 2.6 x 2.6 cm in
the right ovary measures 2.0 x 4.8 x 2.3 cm. Trace pelvic free
fluid. Intramural uterine fibroid identified measuring 3.3 x 3.0 x
2.8 cm.
IMPRESSION: 1. Probable early intrauterine gestational sac, with newly
visualized yolk sac on today's exam. No embryo or cardiac activity
yet visualized. Recommend follow-up quantitative B-HCG levels and
follow-up US in 14 days to assess viability. This recommendation
follows SRU consensus guidelines: Diagnostic Criteria for Nonviable
Pregnancy Early in the First Trimester. N Engl J Med 2809;
2. Stable uterine fibroid.
3. Trace pelvic free fluid.

## 2022-01-12 IMAGING — US US OB TRANSVAGINAL
1 series · 15 of 28 positions shown · non-contrast
Comparison: 12/19/2019

CLINICAL DATA: Follow-up pregnancy.

EXAM:
TRANSVAGINAL OB ULTRASOUND
TECHNIQUE: Transvaginal ultrasound was performed for complete evaluation of the
gestation as well as the maternal uterus, adnexal regions, and
pelvic cul-de-sac.

[Series 1: us ob transvaginal · 15 of 68 slices shown]
[im 1/68]
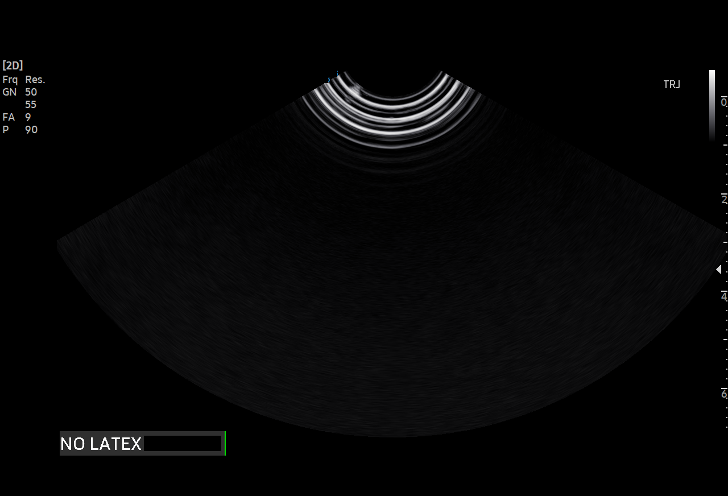
[im 5/68]
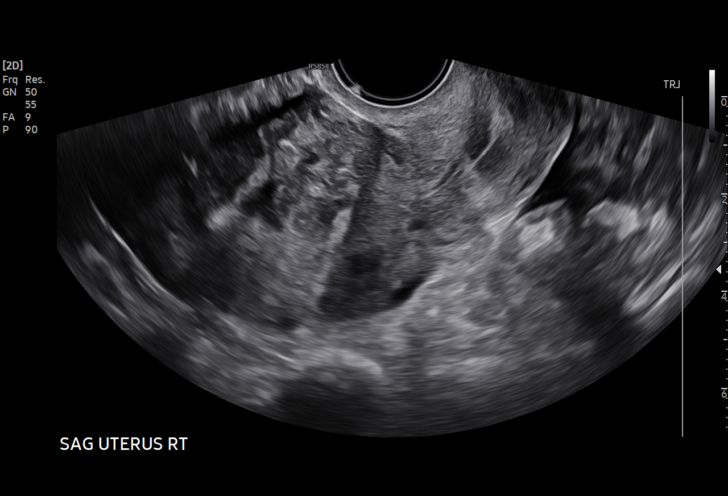
[im 10/68]
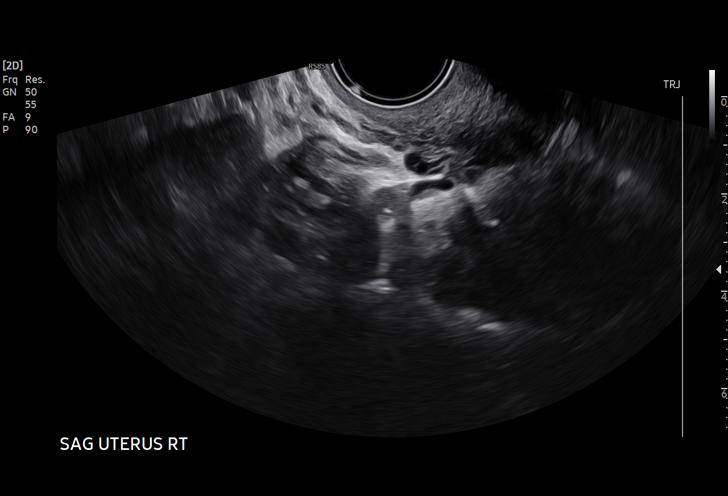
[im 15/68]
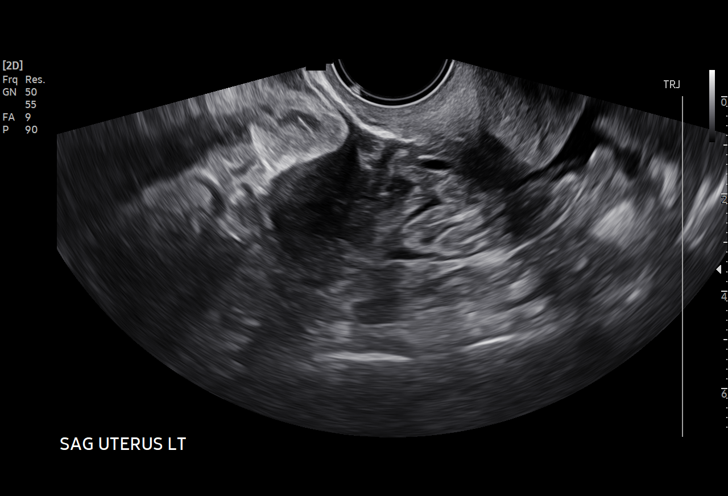
[im 20/68]
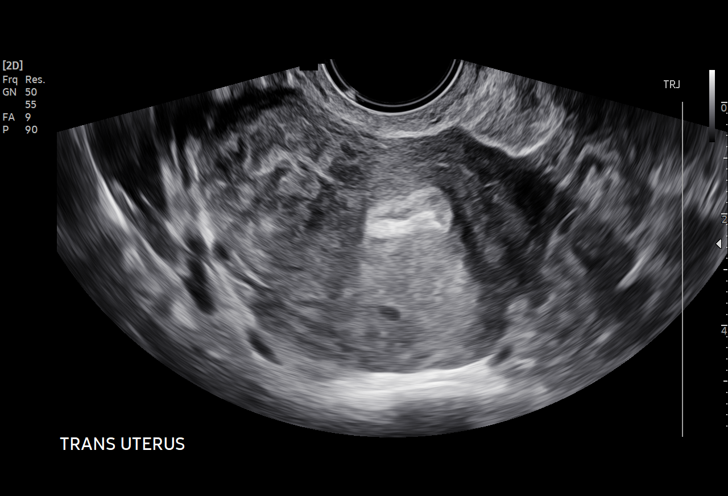
[im 25/68]
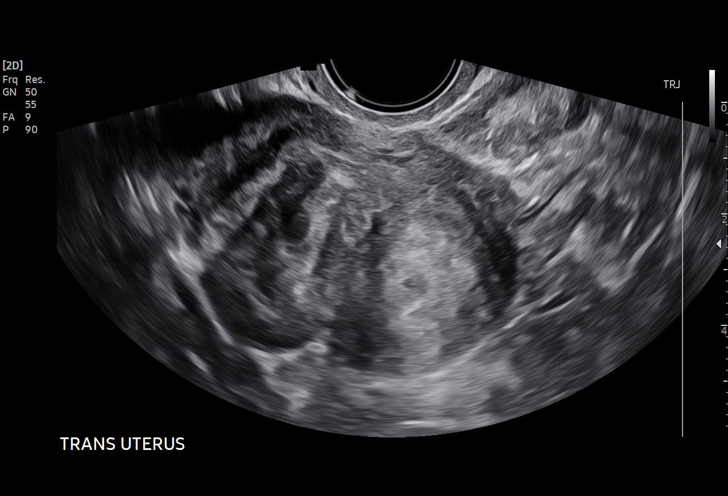
[im 30/68]
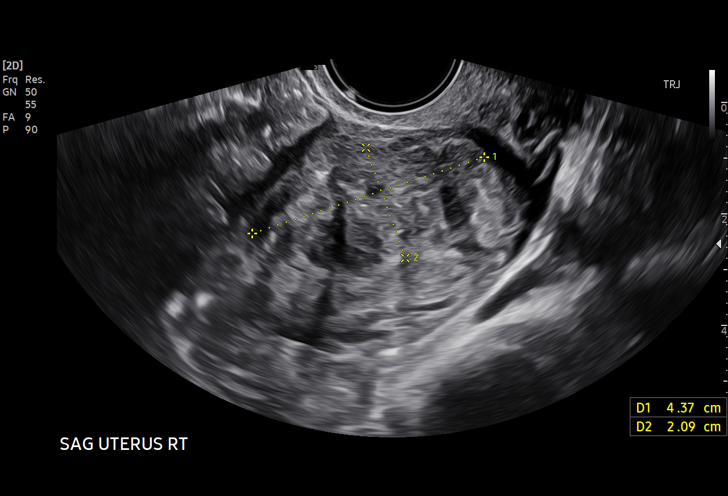
[im 35/68]
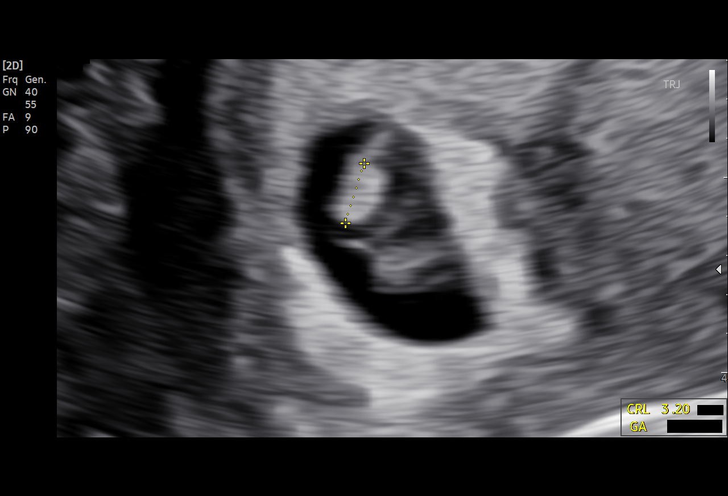
[im 38/68]
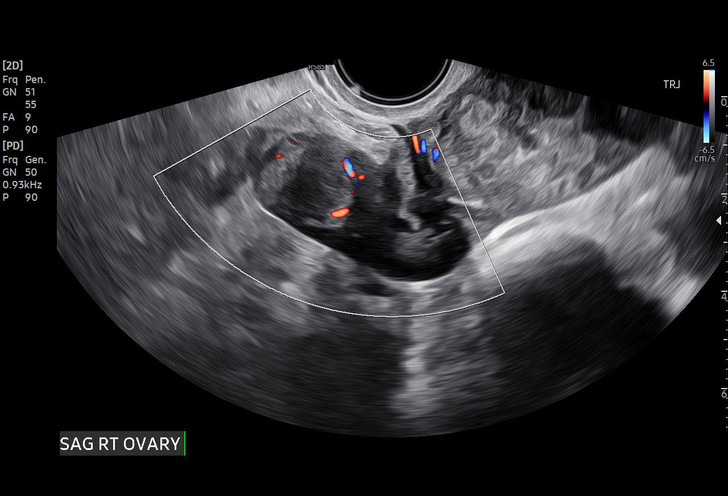
[im 43/68]
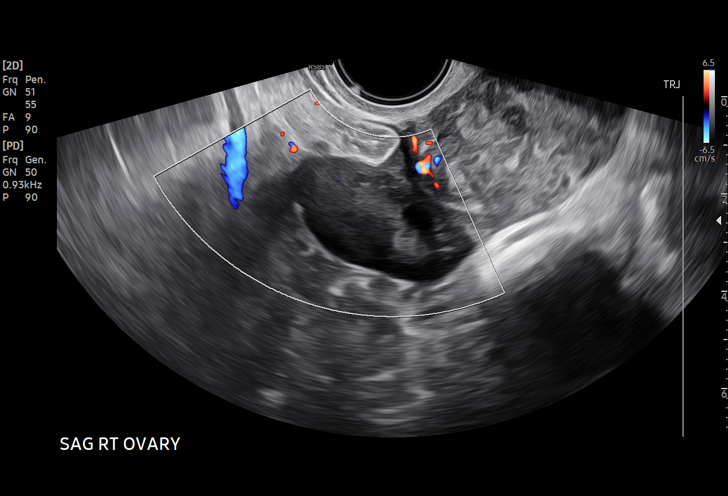
[im 48/68]
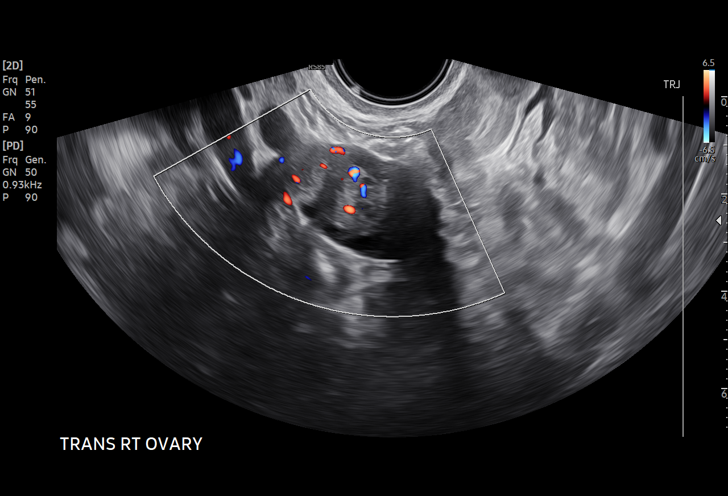
[im 53/68]
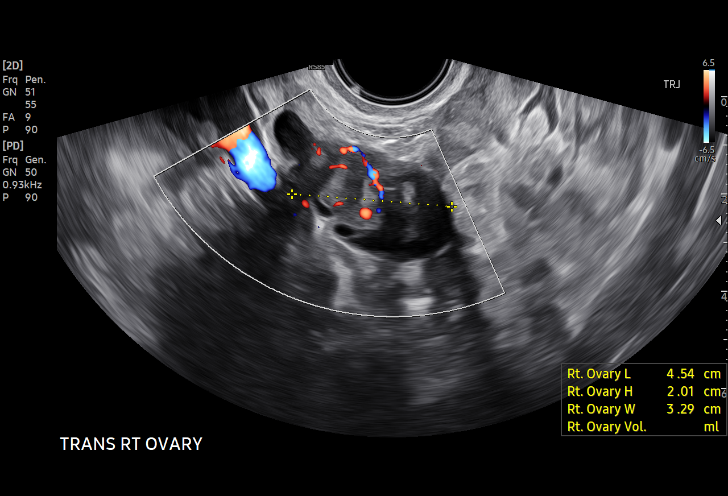
[im 58/68]
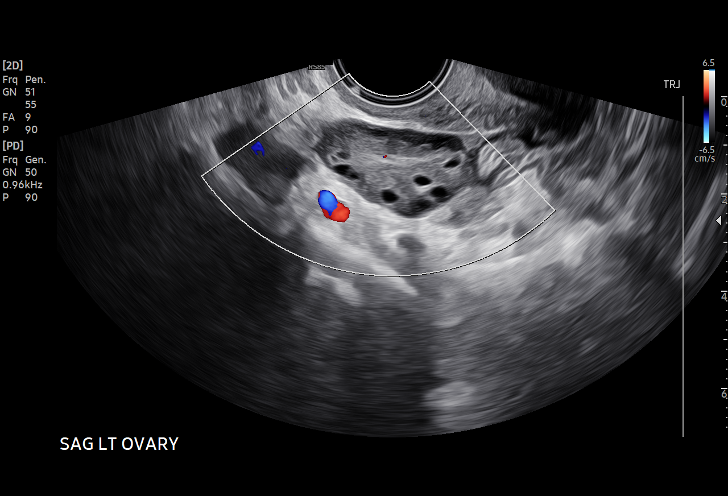
[im 63/68]
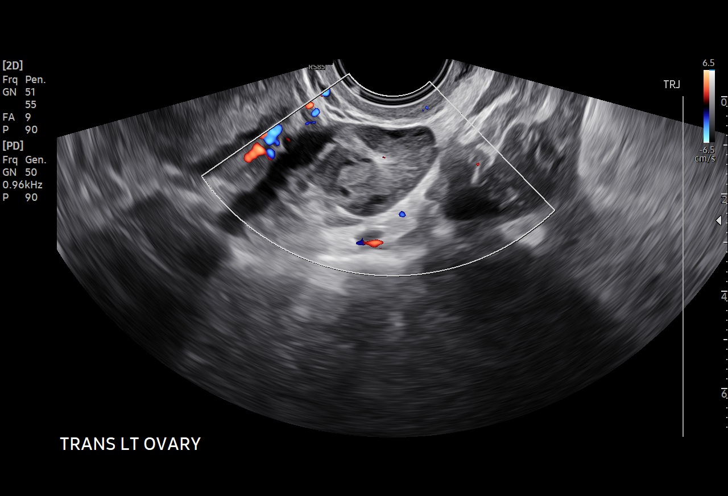
[im 68/68]
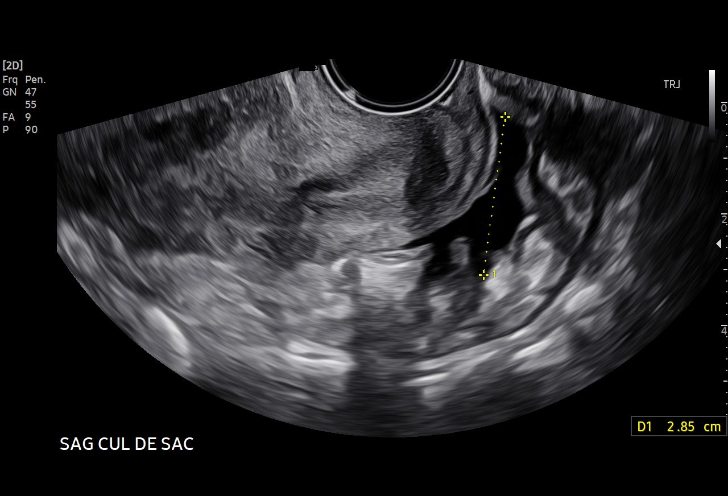

[15 of 28 positions shown; findings below may reference images not displayed]

FINDINGS: Intrauterine gestational sac: Single

Yolk sac:  Visualized.

Embryo:  Visualized.

Cardiac Activity: Visualized.

Heart Rate: 116 bpm

CRL:   3.2 mm   5 w 6 d                  US EDC: 08/20/2020

Subchorionic hemorrhage:  None visualized.

Maternal uterus/adnexae: Heterogeneous rounded mass at the right
fundus measuring 4.4 x 2.1 x 3.9 cm, similar in appearance to the
prior studies. Right ovarian corpus luteal cyst. Bilateral ovaries
are otherwise unremarkable. Small amount of free fluid within the
cul-de-sac.
IMPRESSION: 1. Single live intrauterine gestation measuring 5 weeks 6 days by
crown-rump length.
2. Active embryonic heart tones at 116 bpm.
3. Small volume free fluid within the pelvis, slightly decreased
from prior.
4. Probable right uterine body/fundal fibroid, unchanged.

## 2022-01-28 DIAGNOSIS — Z419 Encounter for procedure for purposes other than remedying health state, unspecified: Secondary | ICD-10-CM | POA: Diagnosis not present

## 2022-02-27 DIAGNOSIS — Z419 Encounter for procedure for purposes other than remedying health state, unspecified: Secondary | ICD-10-CM | POA: Diagnosis not present

## 2022-03-30 DIAGNOSIS — Z419 Encounter for procedure for purposes other than remedying health state, unspecified: Secondary | ICD-10-CM | POA: Diagnosis not present

## 2022-03-30 NOTE — L&D Delivery Note (Signed)
Delivery Note She progressed to complete and pushed for about 30 minutes.  At 10:07 PM a viable female was delivered via VBAC, Spontaneous (Presentation: Right Occiput Anterior).  APGAR: 9, 9; weight pending.   Placenta status: Spontaneous, Intact.  Cord: 3 vessels with the following complications: None.   Anesthesia: Epidural Episiotomy: None Lacerations: Right labial abrasion Suture Repair:  none Est. Blood Loss (mL):  207  Mom to postpartum.  Baby to Couplet care / Skin to Skin.  Uterine scar palpated intact  Kimberly Ponce 01/06/2023, 10:23 PM

## 2022-04-30 DIAGNOSIS — Z419 Encounter for procedure for purposes other than remedying health state, unspecified: Secondary | ICD-10-CM | POA: Diagnosis not present

## 2022-05-10 ENCOUNTER — Inpatient Hospital Stay (HOSPITAL_COMMUNITY): Payer: Medicaid Other

## 2022-05-10 ENCOUNTER — Encounter (HOSPITAL_COMMUNITY): Payer: Self-pay | Admitting: Obstetrics & Gynecology

## 2022-05-10 ENCOUNTER — Inpatient Hospital Stay (HOSPITAL_COMMUNITY)
Admission: AD | Admit: 2022-05-10 | Discharge: 2022-05-10 | Disposition: A | Discharge status: Home / Self Care | Payer: Medicaid Other | Attending: Obstetrics & Gynecology | Admitting: Obstetrics & Gynecology

## 2022-05-10 DIAGNOSIS — R102 Pelvic and perineal pain: Secondary | ICD-10-CM | POA: Diagnosis not present

## 2022-05-10 DIAGNOSIS — O208 Other hemorrhage in early pregnancy: Secondary | ICD-10-CM | POA: Diagnosis not present

## 2022-05-10 DIAGNOSIS — O30001 Twin pregnancy, unspecified number of placenta and unspecified number of amniotic sacs, first trimester: Secondary | ICD-10-CM

## 2022-05-10 DIAGNOSIS — Z3A01 Less than 8 weeks gestation of pregnancy: Secondary | ICD-10-CM

## 2022-05-10 DIAGNOSIS — O209 Hemorrhage in early pregnancy, unspecified: Secondary | ICD-10-CM

## 2022-05-10 DIAGNOSIS — O26891 Other specified pregnancy related conditions, first trimester: Secondary | ICD-10-CM

## 2022-05-10 DIAGNOSIS — O30041 Twin pregnancy, dichorionic/diamniotic, first trimester: Secondary | ICD-10-CM | POA: Diagnosis not present

## 2022-05-10 HISTORY — DX: Polyneuropathy, unspecified: G62.9

## 2022-05-10 HISTORY — DX: Female pelvic inflammatory disease, unspecified: N73.9

## 2022-05-10 HISTORY — DX: Benign neoplasm of connective and other soft tissue, unspecified: D21.9

## 2022-05-10 HISTORY — DX: Gestational (pregnancy-induced) hypertension without significant proteinuria, unspecified trimester: O13.9

## 2022-05-10 LAB — URINALYSIS, ROUTINE W REFLEX MICROSCOPIC
Bilirubin Urine: NEGATIVE
Glucose, UA: NEGATIVE mg/dL
Hgb urine dipstick: NEGATIVE
Ketones, ur: NEGATIVE mg/dL
Leukocytes,Ua: NEGATIVE
Nitrite: NEGATIVE
Protein, ur: NEGATIVE mg/dL
Specific Gravity, Urine: 1.024 (ref 1.005–1.030)
pH: 5 (ref 5.0–8.0)

## 2022-05-10 LAB — CBC
HCT: 35.8 % — ABNORMAL LOW (ref 36.0–46.0)
Hemoglobin: 12.4 g/dL (ref 12.0–15.0)
MCH: 30.7 pg (ref 26.0–34.0)
MCHC: 34.6 g/dL (ref 30.0–36.0)
MCV: 88.6 fL (ref 80.0–100.0)
Platelets: 344 10*3/uL (ref 150–400)
RBC: 4.04 MIL/uL (ref 3.87–5.11)
RDW: 12.5 % (ref 11.5–15.5)
WBC: 6.1 10*3/uL (ref 4.0–10.5)
nRBC: 0 % (ref 0.0–0.2)

## 2022-05-10 LAB — HCG, QUANTITATIVE, PREGNANCY: hCG, Beta Chain, Quant, S: 2728 m[IU]/mL — ABNORMAL HIGH (ref <5)

## 2022-05-10 LAB — WET PREP, GENITAL
Sperm: NONE SEEN
Trich, Wet Prep: NONE SEEN
WBC, Wet Prep HPF POC: <10 (ref <10)
Yeast Wet Prep HPF POC: NONE SEEN

## 2022-05-10 LAB — POCT PREGNANCY, URINE: Preg Test, Ur: POSITIVE — AB

## 2022-05-10 NOTE — MAU Note (Signed)
Kimberly Ponce is a 27 y.o. at Unknown here in MAU reporting: +HPT last wk on Monday. Has been having a little bit of spotting.(Pinkish red) Was spotting when expected period, but never became on.  Started having sharp pains in lower abd started 3 days ago LMP: 1/3 weird period Onset of complaint: over a wk Pain score: 9 Vitals:   05/10/22 0919  BP: 116/70  Pulse: 82  Resp: 16  Temp: 99.1 F (37.3 C)  SpO2: 98%      Lab orders placed from triage:  UA, UPT  Had just started Ozembic 2 wks before +preg test

## 2022-05-10 NOTE — MAU Provider Note (Signed)
History     CSN: [id number]  Arrival date and time: 05/10/22 0851   None     Chief Complaint  Patient presents with   Vaginal Bleeding   Abdominal Pain   Possible Pregnancy   Kimberly Ponce is a 27 y.o. G2P0101 at 20w4dwho presents today with spotting and cramping.   Vaginal Bleeding The patient's primary symptoms include pelvic pain and vaginal bleeding. This is a new problem. The current episode started in the past 7 days. The problem occurs intermittently. The problem has been unchanged. The problem affects both sides. She is pregnant. The vaginal discharge was bloody. The vaginal bleeding is spotting. She has not been passing clots. She has not been passing tissue. Nothing aggravates the symptoms. She has tried nothing for the symptoms. Menstrual history: LMP 04/01/2022.    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   PID (pelvic inflammatory disease)    age 27  Pregnancy induced hypertension    Trichimoniasis 2019    Past Surgical History:  Procedure Laterality Date   CESAREAN SECTION  07/24/2020   Procedure: CESAREAN SECTION;  Surgeon: DDrema Dallas DO;  Location: [facility];  Service: Obstetrics;;   COSMETIC SURGERY     brazilian butt lift   FINGER TENDON REPAIR      Family History  Problem Relation Age of Onset   Hypertension Mother    Cancer Father    Hypertension Father    Cancer Other    Diabetes Other     Social History   Tobacco Use   Smoking status: Former    Types: Cigarettes    Quit date: 11/29/2019    Years since quitting: 2.4   Smokeless tobacco: Never  Vaping Use   Vaping Use: Former  Substance Use Topics   Alcohol use: Not Currently    Comment: occ   Drug use: No    Allergies:  Allergies   Allergen Reactions   Other Other (See Comments)    Dark chocolate: throat swells    No medications prior to admission.    Review of Systems  Genitourinary:  Positive for pelvic pain and vaginal bleeding.  All other systems reviewed and are negative.  Physical Exam   Blood pressure 116/70, pulse 82, temperature 99.1 F (37.3 C), temperature source Oral, resp. rate 16, height 5' 2"$  (1.575 m), weight 93.6 kg, last menstrual period 04/01/2022, SpO2 98 %, unknown if currently breastfeeding.  Physical Exam Constitutional:      Appearance: She is well-developed.  HENT:     Head: Normocephalic.  Eyes:     Pupils: Pupils are equal, round, and reactive to light.  Cardiovascular:     Rate and Rhythm: Normal rate.  Pulmonary:     Effort: Pulmonary effort is normal. No respiratory distress.  Abdominal:     Palpations: Abdomen is soft.     Tenderness: There is no abdominal tenderness.  Genitourinary:  Vagina: No bleeding. Vaginal discharge: mucusy.    Comments: External: no lesion Vagina: small amount of white discharge     Musculoskeletal:        General: Normal range of motion.     Cervical back: Normal range of motion.  Skin:    General: Skin is warm and dry.  Neurological:     Mental Status: She is alert and oriented to person, place, and time.  Psychiatric:        Mood and Affect: Mood normal.        Behavior: Behavior normal.    Results for orders placed or performed during the hospital encounter of 05/10/22 (from the past 24 hour(s))  Wet prep, genital     Status: Abnormal   Collection Time: 05/10/22  9:24 AM   Specimen: Vaginal  Result Value Ref Range   Yeast Wet Prep HPF POC NONE SEEN NONE SEEN   Trich, Wet Prep NONE SEEN NONE SEEN   Clue Cells Wet Prep HPF POC PRESENT (A) NONE SEEN   WBC, Wet Prep HPF POC <10 <10   Sperm NONE SEEN   Urinalysis, Routine w reflex microscopic -Urine, Clean Catch     Status: Abnormal   Collection Time: 05/10/22  9:33 AM   Result Value Ref Range   Color, Urine YELLOW YELLOW   APPearance HAZY (A) CLEAR   Specific Gravity, Urine 1.024 1.005 - 1.030   pH 5.0 5.0 - 8.0   Glucose, UA NEGATIVE NEGATIVE mg/dL   Hgb urine dipstick NEGATIVE NEGATIVE   Bilirubin Urine NEGATIVE NEGATIVE   Ketones, ur NEGATIVE NEGATIVE mg/dL   Protein, ur NEGATIVE NEGATIVE mg/dL   Nitrite NEGATIVE NEGATIVE   Leukocytes,Ua NEGATIVE NEGATIVE  Pregnancy, urine POC     Status: Abnormal   Collection Time: 05/10/22  9:38 AM  Result Value Ref Range   Preg Test, Ur POSITIVE (A) NEGATIVE  CBC     Status: Abnormal   Collection Time: 05/10/22  9:42 AM  Result Value Ref Range   WBC 6.1 4.0 - 10.5 K/uL   RBC 4.04 3.87 - 5.11 MIL/uL   Hemoglobin 12.4 12.0 - 15.0 g/dL   HCT 35.8 (L) 36.0 - 46.0 %   MCV 88.6 80.0 - 100.0 fL   MCH 30.7 26.0 - 34.0 pg   MCHC 34.6 30.0 - 36.0 g/dL   RDW 12.5 11.5 - 15.5 %   Platelets 344 150 - 400 K/uL   nRBC 0.0 0.0 - 0.2 %  hCG, quantitative, pregnancy     Status: Abnormal   Collection Time: 05/10/22  9:42 AM  Result Value Ref Range   hCG, Beta Chain, Quant, S 2,728 (H) <5 mIU/mL   US OB LESS THAN 14 WEEKS WITH OB TRANSVAGINAL  Result Date: 05/10/2022 CLINICAL DATA:  Pelvic pain and vaginal spotting for 3 days. EXAM: TWIN OBSTETRIC <14WK [place] AND TRANSVAGINAL OB US TECHNIQUE: Both transabdominal and transvaginal ultrasound examinations were performed for complete evaluation of the gestation as well as the maternal uterus, adnexal regions, and pelvic cul-de-sac. Transvaginal technique was performed to assess early pregnancy. COMPARISON:  None Available. FINDINGS: Number of IUPs:  2 Chorionicity/Amnionicity:  Dichorionic-diamniotic (thick membrane) TWIN 1 Irregular shape of gestational sac noted as well as internal echogenic debris. Yolk sac:  Not Visualized. Embryo:  Not Visualized. MSD: 7 mm   5 w   3 d TWIN 2 Yolk sac:  Visualized. Embryo:  Not Visualized. MSD: 6 mm   5 w   2 d  Subchorionic hemorrhage:  Small  subchorionic hemorrhage noted. Maternal uterus/adnexae: Both ovaries are normal in appearance. No masses identified. Small amount of simple free fluid noted in the pelvic cul-de-sac. IMPRESSION: Early dichorionic twin IUP with estimated gestational age of [redacted] weeks 3 days by mean sac diameter. Consider correlation with serial b-hCG levels, and followup ultrasound to assess viability in 10-14 days. Small subchorionic hemorrhage. Electronically Signed   By: Marlaine Hind M.D.   On: 05/10/2022 10:34   US OB Comp AddL Gest Less 14 Wks  Result Date: 05/10/2022 CLINICAL DATA:  Pelvic pain and vaginal spotting for 3 days. EXAM: TWIN OBSTETRIC <14WK [place] AND TRANSVAGINAL OB US TECHNIQUE: Both transabdominal and transvaginal ultrasound examinations were performed for complete evaluation of the gestation as well as the maternal uterus, adnexal regions, and pelvic cul-de-sac. Transvaginal technique was performed to assess early pregnancy. COMPARISON:  None Available. FINDINGS: Number of IUPs:  2 Chorionicity/Amnionicity:  Dichorionic-diamniotic (thick membrane) TWIN 1 Irregular shape of gestational sac noted as well as internal echogenic debris. Yolk sac:  Not Visualized. Embryo:  Not Visualized. MSD: 7 mm   5 w   3 d TWIN 2 Yolk sac:  Visualized. Embryo:  Not Visualized. MSD: 6 mm   5 w   2 d Subchorionic hemorrhage:  Small subchorionic hemorrhage noted. Maternal uterus/adnexae: Both ovaries are normal in appearance. No masses identified. Small amount of simple free fluid noted in the pelvic cul-de-sac. IMPRESSION: Early dichorionic twin IUP with estimated gestational age of [redacted] weeks 3 days by mean sac diameter. Consider correlation with serial b-hCG levels, and followup ultrasound to assess viability in 10-14 days. Small subchorionic hemorrhage. Electronically Signed   By: Marlaine Hind M.D.   On: 05/10/2022 10:34     MAU Course  Procedures  MDM   Assessment and Plan   1. Pelvic pain in pregnancy, antepartum, first  trimester   2. Vaginal bleeding in pregnancy, first trimester   3. [redacted] weeks gestation of pregnancy   4. Twin gestation in first trimester, unspecified multiple gestation type    DC home Comfort measures reviewed  1st Trimester precautions  Bleeding precautions RX: none  Return to MAU as needed FU [place] ordered for 2 weeks from now to confirm progression of pregnancy    Follow-up Information     [facility] in 2 week(s).   Specialty: Radiology Why: They will call you with an appointment Contact information: [address], [place] [phone]               Derak Schurman DNP, CNM  05/10/22  11:18 AM

## 2022-05-11 LAB — GC/CHLAMYDIA PROBE AMP (~~LOC~~) NOT AT ARMC
Chlamydia: NEGATIVE
Comment: NEGATIVE
Comment: NORMAL
Neisseria Gonorrhea: NEGATIVE

## 2022-05-22 ENCOUNTER — Inpatient Hospital Stay (HOSPITAL_COMMUNITY): Payer: Medicaid Other

## 2022-05-22 ENCOUNTER — Inpatient Hospital Stay (HOSPITAL_COMMUNITY)
Admission: AD | Admit: 2022-05-22 | Discharge: 2022-05-22 | Disposition: A | Discharge status: Home / Self Care | Payer: Medicaid Other | Attending: Obstetrics & Gynecology | Admitting: Obstetrics & Gynecology

## 2022-05-22 DIAGNOSIS — O208 Other hemorrhage in early pregnancy: Secondary | ICD-10-CM | POA: Diagnosis not present

## 2022-05-22 DIAGNOSIS — Z3A01 Less than 8 weeks gestation of pregnancy: Secondary | ICD-10-CM | POA: Insufficient documentation

## 2022-05-22 DIAGNOSIS — O418X10 Other specified disorders of amniotic fluid and membranes, first trimester, not applicable or unspecified: Secondary | ICD-10-CM

## 2022-05-22 DIAGNOSIS — O209 Hemorrhage in early pregnancy, unspecified: Secondary | ICD-10-CM

## 2022-05-22 DIAGNOSIS — Z87891 Personal history of nicotine dependence: Secondary | ICD-10-CM | POA: Insufficient documentation

## 2022-05-22 DIAGNOSIS — O26891 Other specified pregnancy related conditions, first trimester: Secondary | ICD-10-CM | POA: Insufficient documentation

## 2022-05-22 DIAGNOSIS — R1030 Lower abdominal pain, unspecified: Secondary | ICD-10-CM | POA: Insufficient documentation

## 2022-05-22 DIAGNOSIS — O99891 Other specified diseases and conditions complicating pregnancy: Secondary | ICD-10-CM | POA: Insufficient documentation

## 2022-05-22 LAB — URINALYSIS, ROUTINE W REFLEX MICROSCOPIC
Bilirubin Urine: NEGATIVE
Glucose, UA: NEGATIVE mg/dL
Hgb urine dipstick: NEGATIVE
Ketones, ur: 5 mg/dL — AB
Leukocytes,Ua: NEGATIVE
Nitrite: NEGATIVE
Protein, ur: NEGATIVE mg/dL
Specific Gravity, Urine: 1.029 (ref 1.005–1.030)
pH: 7 (ref 5.0–8.0)

## 2022-05-22 NOTE — MAU Note (Signed)
Kimberly Ponce is a 27 y.o. at 55w2dhere in MAU reporting: cramping got really bad last night.  When went to work today, saw some pink blood when wiped. This is the first she saw since she was last here.   The cramping is more consistent, kind of feels like a UTI. (Radiating aching pain, starts in back and goes to vagina) Denies any other UTI type of symptom Onset of complaint: last night Pain score: 5 Vitals:   05/22/22 1058  BP: 124/70  Pulse: 77  Resp: 16  Temp: 98.9 F (37.2 C)  SpO2: 98%      Lab orders placed from triage:  urine

## 2022-05-22 NOTE — MAU Provider Note (Signed)
History     CSN: [id number]  Arrival date and time: 05/22/22 1038   Event Date/Time   First Provider Initiated Contact with Patient 05/22/22 1343      Chief Complaint  Patient presents with   Vaginal Bleeding   Back Pain   HPI  [name] is a 27 y.o. female G53P0101 @ 49w1dhere in MAU with complaints of continued vaginal bleeding. The bleeding is a light pink color and waxes and wanes. She was seen on 2/11 with this complaint and UKoreashowed ? Twin gestation vs subchorionic hemorrhage. She continues to have lower abdominal pain that radiates from her lower abdomen to her lower back. She was last seen in MAU on 2/11.   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   PID (pelvic inflammatory disease)    age 27  Pregnancy induced hypertension    Trichimoniasis 2019    Past Surgical History:  Procedure Laterality Date   CESAREAN SECTION  07/24/2020   Procedure: CESAREAN SECTION;  Surgeon: DDrema Dallas DO;  Location: [facility];  Service: Obstetrics;;   COSMETIC SURGERY     brazilian butt lift   FINGER TENDON REPAIR      Family History  Problem Relation Age of Onset   Hypertension Mother    Cancer Father    Hypertension Father    Cancer Other    Diabetes Other     Social History   Tobacco Use   Smoking status: Former    Types: Cigarettes    Quit date: 11/29/2019    Years since quitting: 2.4   Smokeless tobacco: Never  Vaping Use   Vaping Use: Former  Substance Use Topics   Alcohol use: Not Currently    Comment: occ   Drug use: No    Allergies:  Allergies  Allergen Reactions   Other Other (See Comments)    Dark chocolate: throat swells    Medications Prior to Admission  Medication Sig Dispense Refill  Last Dose   acetaminophen (TYLENOL) 500 MG tablet Take 2 tablets (1,000 mg total) by mouth every 6 (six) hours. 30 tablet 0    Prenatal Vit-Fe Fumarate-FA (PREPLUS) 27-1 MG TABS Take 1 tablet by mouth daily. 30 tablet 13    Results for orders placed or performed during the hospital encounter of 05/22/22 (from the past 48 hour(s))  Urinalysis, Routine w reflex microscopic -Urine, Clean Catch     Status: Abnormal   Collection Time: 05/22/22 11:00 AM  Result Value Ref Range   Color, Urine YELLOW YELLOW   APPearance CLEAR CLEAR   Specific Gravity, Urine 1.029 1.005 - 1.030   pH 7.0 5.0 - 8.0   Glucose, UA NEGATIVE NEGATIVE mg/dL   Hgb urine dipstick NEGATIVE NEGATIVE   Bilirubin Urine NEGATIVE NEGATIVE   Ketones, ur 5 (A) NEGATIVE mg/dL   Protein, ur NEGATIVE NEGATIVE mg/dL   Nitrite NEGATIVE NEGATIVE   Leukocytes,Ua NEGATIVE NEGATIVE  Comment: Performed at [facility], [place] [address], [place], [place] [address]     US OB Transvaginal  Result Date: 05/22/2022 CLINICAL DATA:  Vaginal bleeding in first trimester of pregnancy, LMP 04/01/2022 EXAM: TRANSVAGINAL OB ULTRASOUND TECHNIQUE: Transvaginal ultrasound was performed for complete evaluation of the gestation as well as the maternal uterus, adnexal regions, and pelvic cul-de-sac. COMPARISON:  05/10/2022 FINDINGS: Intrauterine gestational sac: Present, single Yolk sac:  Present Embryo:  Present Cardiac Activity: Present Heart Rate: 117 bpm CRL:   4.4 mm   6 w 1 d                  [place] EDC: 01/14/2023 Subchorionic hemorrhage:  Small subchorionic hemorrhage Maternal uterus/adnexae: Small corpus luteum RIGHT ovary. Nonvisualization of LEFT ovary. Remainder of uterus unremarkable. Trace free pelvic fluid. No adnexal masses. IMPRESSION: Single live intrauterine gestation at 6 weeks 1 day EGA by crown-rump length. Small subchronic hemorrhage. Electronically Signed   By: Lavonia Dana M.D.   On: 05/22/2022 13:28     Review of Systems   Constitutional:  Negative for fever.  Gastrointestinal:  Negative for abdominal pain.  Genitourinary:  Positive for vaginal bleeding.   Physical Exam   Blood pressure 124/70, pulse 77, temperature 98.9 F (37.2 C), resp. rate 16, height '5\' 2"'$  (1.575 m), weight 96.9 kg, last menstrual period 04/01/2022, SpO2 98 %, unknown if currently breastfeeding.  Physical Exam Constitutional:      Appearance: Normal appearance.  HENT:     Head: Normocephalic.  Skin:    General: Skin is warm.  Neurological:     Mental Status: She is alert and oriented to person, place, and time.  Psychiatric:        Behavior: Behavior normal.    MAU Course  Procedures  MDM  Repeat US done. Discussed results in detail with the patient.  O positive blood type    Assessment and Plan   A:  1. Vaginal bleeding in pregnancy, first trimester   2. Subchorionic hematoma in first trimester, single or unspecified fetus      P:  Dc home Return to MAU for emergencies Discussed how bleeding can wax and wane throughout the first trimester. Recommend pelvic rest Start prenatal care Prenatal vitamins daily.   Lezlie Lye, NP 05/22/2022 7:19 PM

## 2022-05-26 ENCOUNTER — Other Ambulatory Visit: Payer: Medicaid Other

## 2022-05-29 DIAGNOSIS — Z419 Encounter for procedure for purposes other than remedying health state, unspecified: Secondary | ICD-10-CM | POA: Diagnosis not present

## 2022-06-12 DIAGNOSIS — N912 Amenorrhea, unspecified: Secondary | ICD-10-CM | POA: Diagnosis not present

## 2022-06-12 DIAGNOSIS — Z113 Encounter for screening for infections with a predominantly sexual mode of transmission: Secondary | ICD-10-CM | POA: Diagnosis not present

## 2022-06-12 DIAGNOSIS — Z369 Encounter for antenatal screening, unspecified: Secondary | ICD-10-CM | POA: Diagnosis not present

## 2022-06-12 DIAGNOSIS — N925 Other specified irregular menstruation: Secondary | ICD-10-CM | POA: Diagnosis not present

## 2022-06-12 DIAGNOSIS — R87810 Cervical high risk human papillomavirus (HPV) DNA test positive: Secondary | ICD-10-CM | POA: Diagnosis not present

## 2022-06-12 DIAGNOSIS — Z124 Encounter for screening for malignant neoplasm of cervix: Secondary | ICD-10-CM | POA: Diagnosis not present

## 2022-06-25 ENCOUNTER — Other Ambulatory Visit: Payer: Self-pay | Admitting: Obstetrics and Gynecology

## 2022-06-25 DIAGNOSIS — Z363 Encounter for antenatal screening for malformations: Secondary | ICD-10-CM

## 2022-06-29 DIAGNOSIS — Z419 Encounter for procedure for purposes other than remedying health state, unspecified: Secondary | ICD-10-CM | POA: Diagnosis not present

## 2022-06-30 ENCOUNTER — Other Ambulatory Visit: Payer: Self-pay | Admitting: *Deleted

## 2022-06-30 ENCOUNTER — Ambulatory Visit: Payer: Medicaid Other | Attending: Obstetrics and Gynecology

## 2022-06-30 ENCOUNTER — Ambulatory Visit: Payer: Medicaid Other | Admitting: *Deleted

## 2022-06-30 VITALS — BP 119/75 | Temp 79.0°F

## 2022-06-30 DIAGNOSIS — O131 Gestational [pregnancy-induced] hypertension without significant proteinuria, first trimester: Secondary | ICD-10-CM

## 2022-06-30 DIAGNOSIS — Z363 Encounter for antenatal screening for malformations: Secondary | ICD-10-CM | POA: Insufficient documentation

## 2022-06-30 DIAGNOSIS — Z362 Encounter for other antenatal screening follow-up: Secondary | ICD-10-CM

## 2022-07-06 LAB — OB RESULTS CONSOLE HIV ANTIBODY (ROUTINE TESTING): HIV: NONREACTIVE

## 2022-07-06 LAB — OB RESULTS CONSOLE HEPATITIS B SURFACE ANTIGEN: Hepatitis B Surface Ag: NEGATIVE

## 2022-07-06 LAB — OB RESULTS CONSOLE RUBELLA ANTIBODY, IGM: Rubella: IMMUNE

## 2022-08-01 ENCOUNTER — Inpatient Hospital Stay (HOSPITAL_COMMUNITY)
Admission: AD | Admit: 2022-08-01 | Discharge: 2022-08-01 | Disposition: A | Discharge status: Home / Self Care | Payer: Medicaid Other | Attending: Obstetrics and Gynecology | Admitting: Obstetrics and Gynecology

## 2022-08-01 ENCOUNTER — Inpatient Hospital Stay (HOSPITAL_BASED_OUTPATIENT_CLINIC_OR_DEPARTMENT_OTHER): Payer: Medicaid Other

## 2022-08-01 ENCOUNTER — Encounter (HOSPITAL_COMMUNITY): Payer: Self-pay | Admitting: Obstetrics and Gynecology

## 2022-08-01 DIAGNOSIS — Z87891 Personal history of nicotine dependence: Secondary | ICD-10-CM | POA: Diagnosis not present

## 2022-08-01 DIAGNOSIS — O09212 Supervision of pregnancy with history of pre-term labor, second trimester: Secondary | ICD-10-CM

## 2022-08-01 DIAGNOSIS — E669 Obesity, unspecified: Secondary | ICD-10-CM

## 2022-08-01 DIAGNOSIS — Z7982 Long term (current) use of aspirin: Secondary | ICD-10-CM | POA: Insufficient documentation

## 2022-08-01 DIAGNOSIS — O4693 Antepartum hemorrhage, unspecified, third trimester: Secondary | ICD-10-CM | POA: Insufficient documentation

## 2022-08-01 DIAGNOSIS — O34219 Maternal care for unspecified type scar from previous cesarean delivery: Secondary | ICD-10-CM | POA: Insufficient documentation

## 2022-08-01 DIAGNOSIS — O3442 Maternal care for other abnormalities of cervix, second trimester: Secondary | ICD-10-CM | POA: Insufficient documentation

## 2022-08-01 DIAGNOSIS — N841 Polyp of cervix uteri: Secondary | ICD-10-CM | POA: Diagnosis not present

## 2022-08-01 DIAGNOSIS — O99213 Obesity complicating pregnancy, third trimester: Secondary | ICD-10-CM | POA: Diagnosis not present

## 2022-08-01 DIAGNOSIS — D259 Leiomyoma of uterus, unspecified: Secondary | ICD-10-CM

## 2022-08-01 DIAGNOSIS — O26892 Other specified pregnancy related conditions, second trimester: Secondary | ICD-10-CM | POA: Insufficient documentation

## 2022-08-01 DIAGNOSIS — O209 Hemorrhage in early pregnancy, unspecified: Secondary | ICD-10-CM

## 2022-08-01 DIAGNOSIS — O09292 Supervision of pregnancy with other poor reproductive or obstetric history, second trimester: Secondary | ICD-10-CM

## 2022-08-01 DIAGNOSIS — Z3A16 16 weeks gestation of pregnancy: Secondary | ICD-10-CM | POA: Insufficient documentation

## 2022-08-01 DIAGNOSIS — O3412 Maternal care for benign tumor of corpus uteri, second trimester: Secondary | ICD-10-CM

## 2022-08-01 DIAGNOSIS — N939 Abnormal uterine and vaginal bleeding, unspecified: Secondary | ICD-10-CM | POA: Diagnosis not present

## 2022-08-01 DIAGNOSIS — O99212 Obesity complicating pregnancy, second trimester: Secondary | ICD-10-CM

## 2022-08-01 LAB — WET PREP, GENITAL
Clue Cells Wet Prep HPF POC: NONE SEEN
Sperm: NONE SEEN
Trich, Wet Prep: NONE SEEN
WBC, Wet Prep HPF POC: <10 (ref <10)
Yeast Wet Prep HPF POC: NONE SEEN

## 2022-08-01 LAB — URINALYSIS, ROUTINE W REFLEX MICROSCOPIC
Bilirubin Urine: NEGATIVE
Glucose, UA: NEGATIVE mg/dL
Hgb urine dipstick: NEGATIVE
Ketones, ur: NEGATIVE mg/dL
Leukocytes,Ua: NEGATIVE
Nitrite: NEGATIVE
Protein, ur: NEGATIVE mg/dL
Specific Gravity, Urine: 1.027 (ref 1.005–1.030)
pH: 6 (ref 5.0–8.0)

## 2022-08-01 NOTE — MAU Note (Signed)
.  Kimberly Ponce is a 27 y.o. at [redacted]w[redacted]d here in MAU reporting: having some cramping off on on all week. Cramping a little more today and then went to [place] and wiped and had some spotting.  Denies recent intercourse.  LMP:  Onset of complaint: today Pain score: 5  Vitals:   08/01/22 1605  BP: 119/74  Pulse: 89  Resp: 18  Temp: 98.5 F (36.9 C)     FHT:154 Lab orders placed from triage:  u/a

## 2022-08-01 NOTE — MAU Provider Note (Signed)
History     CSN: [id number]  Arrival date and time: 08/01/22 1533   Event Date/Time   First Provider Initiated Contact with Patient 08/01/22 1632      Chief Complaint  Patient presents with   Vaginal Bleeding   Kimberly Ponce , a  27 y.o. G2P0101 at [redacted]w[redacted]d presents to MAU with complaints of new onset dull cramping and vaginal spotting. Patient reports some "dull intermittent period like cramps" in her lower abdomen today. She currently rates pain a 5-6/10 and denies attempting to relieve symptoms. She reports it feeling "like a UTI." She reports some pain and discomfort with urination. She denies abnormal vaginal discharge and reports last intercourse > 1 week. She also reports some bright red vaginal spotting with wiping. She denies wearing a pad or passing clots. She states "there were several bright red streaks with wiping." She endorses feeling flutters.          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   PID (pelvic inflammatory disease)    age 26   Pregnancy induced hypertension    Trichimoniasis 2019    Past Surgical History:  Procedure Laterality Date   CESAREAN SECTION  07/24/2020   Procedure: CESAREAN SECTION;  Surgeon: Steva Ready, DO;  Location: [facility];  Service: Obstetrics;;   COSMETIC SURGERY     brazilian butt lift   FINGER TENDON REPAIR      Family History  Problem Relation Age of Onset   Hypertension Mother    Cancer Father    Hypertension Father    Cancer Other    Diabetes Other     Social History   Tobacco Use   Smoking status: Former    Types: Cigarettes    Quit date: 11/29/2019    Years since quitting: 2.6   Smokeless tobacco: Never  Vaping Use   Vaping Use: Former  Substance Use Topics    Alcohol use: Not Currently    Comment: occ   Drug use: No    Allergies:  Allergies  Allergen Reactions   Other Other (See Comments)    Dark chocolate: throat swells    Medications Prior to Admission  Medication Sig Dispense Refill Last Dose   acetaminophen (TYLENOL) 500 MG tablet Take 2 tablets (1,000 mg total) by mouth every 6 (six) hours. 30 tablet 0 08/01/2022   aspirin EC 81 MG tablet Take 81 mg by mouth daily. Swallow whole.      cholecalciferol (VITAMIN D3) 25 MCG (1000 UNIT) tablet Take 4,000 Units by mouth daily.   08/01/2022   Prenatal Vit-Fe Fumarate-FA (PREPLUS) 27-1 MG TABS Take 1 tablet by mouth daily. 30 tablet 13 08/01/2022    Review of Systems  Constitutional:  Negative for chills, fatigue and fever.  Eyes:  Negative for pain and visual disturbance.  Respiratory:  Negative for apnea, shortness of breath and wheezing.   Cardiovascular:  Negative for chest pain and palpitations.  Gastrointestinal:  Positive for abdominal pain. Negative for constipation, diarrhea, nausea and vomiting.  Genitourinary:  Positive for dysuria, frequency, pelvic pain and vaginal bleeding. Negative for difficulty urinating, vaginal discharge and vaginal pain.  Musculoskeletal:  Negative for back pain.  Neurological:  Negative for seizures, weakness and headaches.  Psychiatric/Behavioral:  Negative for suicidal ideas.    Physical Exam   Blood pressure 119/74, pulse 89, temperature 98.5 F (36.9 C), resp. rate 18, height 5\' 2"  (1.575 m), weight 99.8 kg, last menstrual period 04/01/2022, unknown if currently breastfeeding.  Physical Exam Vitals and nursing note reviewed.  Constitutional:      General: She is not in acute distress.    Appearance: Normal appearance.  HENT:     Head: Normocephalic.  Cardiovascular:     Rate and Rhythm: Normal rate.  Pulmonary:     Effort: Pulmonary effort is normal.  Abdominal:     Palpations: Abdomen is soft.     Tenderness: There is no abdominal  tenderness. There is no guarding.     Comments: Pregnant   Genitourinary:    General: Normal vulva.     Vagina: No vaginal discharge.     Cervix: Lesion and cervical bleeding present.        Comments: Small ~1cm beefy red nodule appearing within the cervical os. Normal white vaginal discharge also observed. Wiped clean with 2 fox swabs. Bleeding noted on the first one.   Cervix appears visually closed.  Musculoskeletal:     Cervical back: Normal range of motion.  Skin:    General: Skin is warm and dry.  Neurological:     Mental Status: She is alert and oriented to person, place, and time.  Psychiatric:        Mood and Affect: Mood normal.    FHT obtained in triage.   MAU Course  Procedures Orders Placed This Encounter  Procedures   Wet prep, genital   [place] MFM OB Limited   Urinalysis, Routine w reflex microscopic -Urine, Clean Catch   Results for orders placed or performed during the hospital encounter of 08/01/22 (from the past 24 hour(s))  Urinalysis, Routine w reflex microscopic -Urine, Clean Catch     Status: None   Collection Time: 08/01/22  4:24 PM  Result Value Ref Range   Color, Urine YELLOW YELLOW   APPearance CLEAR CLEAR   Specific Gravity, Urine 1.027 1.005 - 1.030   pH 6.0 5.0 - 8.0   Glucose, UA NEGATIVE NEGATIVE mg/dL   Hgb urine dipstick NEGATIVE NEGATIVE   Bilirubin Urine NEGATIVE NEGATIVE   Ketones, ur NEGATIVE NEGATIVE mg/dL   Protein, ur NEGATIVE NEGATIVE mg/dL   Nitrite NEGATIVE NEGATIVE   Leukocytes,Ua NEGATIVE NEGATIVE  Wet prep, genital     Status: None   Collection Time: 08/01/22  4:46 PM   Specimen: Vaginal  Result Value Ref Range   Yeast Wet Prep HPF POC NONE SEEN NONE SEEN   Trich, Wet Prep NONE SEEN NONE SEEN   Clue Cells Wet Prep HPF POC NONE SEEN NONE SEEN   WBC, Wet Prep HPF POC <10 <10   Sperm NONE SEEN     MDM - Given abnormal appearance of cervix, [place] ordered to rule out shortened cervical length and tunneling. Differential  include a cervical polyp.  - CNM reviewed parental records and NOB PAP results include Normal with Positive HRHPV not 16/18/45.  - Wet prep normal.  - UA normal, low suspicion for UTI causing pain and cramping.  -  GC pending on discharge  - Patient reports no pain at this time.  - PreLIm [place] results revealed a single living IUP ~[redacted] weeks gestation with anterior placenta. Cervical length normal at 3.6cm. Cervix appeared visually closed on SVE. Low suspicion for preterm labor or cervical insufficiency.  - High suspicion for bleeding on cervical polyp.  - Plan for discharge   Assessment and Plan   1. Polyp at cervical os   2. Abnormal uterine bleeding due to endocervical polyp   3. [redacted] weeks gestation of pregnancy    - Reviewed that bleeding is likely caused by cervical polyp during pregnancy.  - Reviewed expectations and worsening signs and symptoms. Information on cervical polyps reviewed.  - Bleeding precautions and return precautions provided.  - Preterm labor precautions reviewed.  - Patient discharged home in stable condition and may return to MAU if condition worsens or as needed.   Claudette Head, MSN CNM  08/01/2022, 4:32 PM

## 2022-08-03 LAB — GC/CHLAMYDIA PROBE AMP (~~LOC~~) NOT AT ARMC
Chlamydia: NEGATIVE
Comment: NEGATIVE
Comment: NORMAL
Neisseria Gonorrhea: NEGATIVE

## 2022-08-21 ENCOUNTER — Other Ambulatory Visit: Payer: Self-pay

## 2022-08-21 ENCOUNTER — Encounter: Payer: Self-pay | Admitting: *Deleted

## 2022-08-21 DIAGNOSIS — Z8759 Personal history of other complications of pregnancy, childbirth and the puerperium: Secondary | ICD-10-CM | POA: Insufficient documentation

## 2022-08-25 ENCOUNTER — Ambulatory Visit: Payer: Medicaid Other

## 2022-12-17 LAB — OB RESULTS CONSOLE GBS: GBS: POSITIVE

## 2022-12-31 ENCOUNTER — Other Ambulatory Visit: Payer: Self-pay | Admitting: Obstetrics and Gynecology

## 2022-12-31 DIAGNOSIS — Z98891 History of uterine scar from previous surgery: Secondary | ICD-10-CM

## 2023-01-01 ENCOUNTER — Telehealth (HOSPITAL_COMMUNITY): Payer: Self-pay | Admitting: *Deleted

## 2023-01-02 ENCOUNTER — Inpatient Hospital Stay (HOSPITAL_COMMUNITY): Payer: Self-pay

## 2023-01-02 ENCOUNTER — Inpatient Hospital Stay (HOSPITAL_COMMUNITY): Admission: RE | Admit: 2023-01-02 | Payer: Self-pay | Source: Home / Self Care | Admitting: Obstetrics and Gynecology

## 2023-01-06 ENCOUNTER — Inpatient Hospital Stay (HOSPITAL_COMMUNITY): Payer: Medicaid Other | Admitting: Anesthesiology

## 2023-01-06 ENCOUNTER — Encounter (HOSPITAL_COMMUNITY): Payer: Self-pay | Admitting: Obstetrics and Gynecology

## 2023-01-06 ENCOUNTER — Inpatient Hospital Stay (HOSPITAL_COMMUNITY)
Admission: RE | Admit: 2023-01-06 | Discharge: 2023-01-08 | DRG: 807 | Disposition: A | Discharge status: Home / Self Care | Payer: Medicaid Other | Attending: Obstetrics and Gynecology | Admitting: Obstetrics and Gynecology

## 2023-01-06 DIAGNOSIS — O26893 Other specified pregnancy related conditions, third trimester: Secondary | ICD-10-CM | POA: Diagnosis present

## 2023-01-06 DIAGNOSIS — Z3A38 38 weeks gestation of pregnancy: Secondary | ICD-10-CM

## 2023-01-06 DIAGNOSIS — O99824 Streptococcus B carrier state complicating childbirth: Secondary | ICD-10-CM | POA: Diagnosis present

## 2023-01-06 DIAGNOSIS — O34219 Maternal care for unspecified type scar from previous cesarean delivery: Principal | ICD-10-CM | POA: Diagnosis present

## 2023-01-06 DIAGNOSIS — Z87891 Personal history of nicotine dependence: Secondary | ICD-10-CM | POA: Diagnosis not present

## 2023-01-06 DIAGNOSIS — O9902 Anemia complicating childbirth: Secondary | ICD-10-CM | POA: Diagnosis present

## 2023-01-06 DIAGNOSIS — O99214 Obesity complicating childbirth: Secondary | ICD-10-CM | POA: Diagnosis present

## 2023-01-06 DIAGNOSIS — Z8249 Family history of ischemic heart disease and other diseases of the circulatory system: Secondary | ICD-10-CM | POA: Diagnosis not present

## 2023-01-06 DIAGNOSIS — Z833 Family history of diabetes mellitus: Secondary | ICD-10-CM

## 2023-01-06 DIAGNOSIS — Z3490 Encounter for supervision of normal pregnancy, unspecified, unspecified trimester: Principal | ICD-10-CM

## 2023-01-06 LAB — CBC
HCT: 29.8 % — ABNORMAL LOW (ref 36.0–46.0)
Hemoglobin: 9.7 g/dL — ABNORMAL LOW (ref 12.0–15.0)
MCH: 29.4 pg (ref 26.0–34.0)
MCHC: 32.6 g/dL (ref 30.0–36.0)
MCV: 90.3 fL (ref 80.0–100.0)
Platelets: 248 10*3/uL (ref 150–400)
RBC: 3.3 MIL/uL — ABNORMAL LOW (ref 3.87–5.11)
RDW: 13.4 % (ref 11.5–15.5)
WBC: 5.3 10*3/uL (ref 4.0–10.5)
nRBC: 0 % (ref 0.0–0.2)

## 2023-01-06 LAB — TYPE AND SCREEN
ABO/RH(D): O POS
Antibody Screen: NEGATIVE

## 2023-01-06 LAB — HIV ANTIBODY (ROUTINE TESTING W REFLEX): HIV Screen 4th Generation wRfx: NONREACTIVE

## 2023-01-06 LAB — RPR: RPR Ser Ql: NONREACTIVE

## 2023-01-06 MED ORDER — OXYTOCIN-SODIUM CHLORIDE 30-0.9 UT/500ML-% IV SOLN
1.0000 m[IU]/min | INTRAVENOUS | Status: DC
  Administered 2023-01-06: 2 m[IU]/min via INTRAVENOUS
  Filled 2023-01-06: qty 500

## 2023-01-06 MED ORDER — OXYTOCIN-SODIUM CHLORIDE 30-0.9 UT/500ML-% IV SOLN
1.0000 m[IU]/min | INTRAVENOUS | Status: DC
  Administered 2023-01-06: 8 m[IU]/min via INTRAVENOUS

## 2023-01-06 MED ORDER — LIDOCAINE HCL (PF) 1 % IJ SOLN
INTRAMUSCULAR | Status: DC | PRN
  Administered 2023-01-06 (×2): 5 mL via EPIDURAL

## 2023-01-06 MED ORDER — LACTATED RINGERS IV SOLN: 500.0000 mL | INTRAVENOUS | Status: DC | PRN

## 2023-01-06 MED ORDER — LIDOCAINE HCL (PF) 1 % IJ SOLN: 30.0000 mL | INTRAMUSCULAR | Status: DC | PRN

## 2023-01-06 MED ORDER — SODIUM CHLORIDE 0.9 % IV SOLN
5.0000 10*6.[IU] | Freq: Once | INTRAVENOUS | Status: AC
  Administered 2023-01-06: 5 10*6.[IU] via INTRAVENOUS
  Filled 2023-01-06: qty 5

## 2023-01-06 MED ORDER — PHENYLEPHRINE 80 MCG/ML (10ML) SYRINGE FOR IV PUSH (FOR BLOOD PRESSURE SUPPORT): 80.0000 ug | PREFILLED_SYRINGE | INTRAVENOUS | Status: DC | PRN

## 2023-01-06 MED ORDER — PENICILLIN G POT IN DEXTROSE 60000 UNIT/ML IV SOLN
3.0000 10*6.[IU] | INTRAVENOUS | Status: DC
  Administered 2023-01-06 (×4): 3 10*6.[IU] via INTRAVENOUS
  Filled 2023-01-06 (×6): qty 50

## 2023-01-06 MED ORDER — OXYTOCIN BOLUS FROM INFUSION
333.0000 mL | Freq: Once | INTRAVENOUS | Status: AC
  Administered 2023-01-06: 333 mL via INTRAVENOUS

## 2023-01-06 MED ORDER — SOD CITRATE-CITRIC ACID 500-334 MG/5ML PO SOLN: 30.0000 mL | ORAL | Status: DC | PRN

## 2023-01-06 MED ORDER — OXYCODONE-ACETAMINOPHEN 5-325 MG PO TABS: 1.0000 | ORAL_TABLET | ORAL | Status: DC | PRN

## 2023-01-06 MED ORDER — EPHEDRINE 5 MG/ML INJ: 10.0000 mg | INTRAVENOUS | Status: DC | PRN

## 2023-01-06 MED ORDER — PHENYLEPHRINE 80 MCG/ML (10ML) SYRINGE FOR IV PUSH (FOR BLOOD PRESSURE SUPPORT)
80.0000 ug | PREFILLED_SYRINGE | INTRAVENOUS | Status: DC | PRN
  Filled 2023-01-06: qty 10

## 2023-01-06 MED ORDER — LACTATED RINGERS IV SOLN: 500.0000 mL | Freq: Once | INTRAVENOUS | Status: DC

## 2023-01-06 MED ORDER — OXYCODONE-ACETAMINOPHEN 5-325 MG PO TABS: 2.0000 | ORAL_TABLET | ORAL | Status: DC | PRN

## 2023-01-06 MED ORDER — ONDANSETRON HCL 4 MG/2ML IJ SOLN: 4.0000 mg | Freq: Four times a day (QID) | INTRAMUSCULAR | Status: DC | PRN

## 2023-01-06 MED ORDER — LACTATED RINGERS IV SOLN: INTRAVENOUS | Status: DC

## 2023-01-06 MED ORDER — FENTANYL-BUPIVACAINE-NACL 0.5-0.125-0.9 MG/250ML-% EP SOLN
12.0000 mL/h | EPIDURAL | Status: DC | PRN
  Administered 2023-01-06: 12 mL/h via EPIDURAL
  Filled 2023-01-06: qty 250

## 2023-01-06 MED ORDER — FENTANYL CITRATE (PF) 100 MCG/2ML IJ SOLN: 50.0000 ug | INTRAMUSCULAR | Status: DC | PRN

## 2023-01-06 MED ORDER — TERBUTALINE SULFATE 1 MG/ML IJ SOLN: 0.2500 mg | Freq: Once | INTRAMUSCULAR | Status: DC | PRN

## 2023-01-06 MED ORDER — OXYTOCIN-SODIUM CHLORIDE 30-0.9 UT/500ML-% IV SOLN
2.5000 [IU]/h | INTRAVENOUS | Status: DC
  Administered 2023-01-06: 2.5 [IU]/h via INTRAVENOUS
  Filled 2023-01-06: qty 500

## 2023-01-06 MED ORDER — ACETAMINOPHEN 325 MG PO TABS: 650.0000 mg | ORAL_TABLET | ORAL | Status: DC | PRN

## 2023-01-06 MED ORDER — DIPHENHYDRAMINE HCL 50 MG/ML IJ SOLN: 12.5000 mg | INTRAMUSCULAR | Status: DC | PRN

## 2023-01-06 NOTE — Progress Notes (Signed)
Comfortable with epidural Afeb, VSS FHT-130-140, mod variability, + accels, had some variable decels, Cat 2, ctx q 3-4 min VE-5/50/-2, vtx, IUPC inserted Will continue pitocin and PCN, monitor FHT now with IUPC to better delineate ctx

## 2023-01-06 NOTE — Anesthesia Procedure Notes (Signed)
Epidural Patient location during procedure: OB Start time: 01/06/2023 3:18 PM End time: 01/06/2023 3:28 PM  Staffing Anesthesiologist: Mal Amabile, MD Performed: anesthesiologist   Preanesthetic Checklist Completed: patient identified, IV checked, site marked, risks and benefits discussed, surgical consent, monitors and equipment checked, pre-op evaluation and timeout performed  Epidural Patient position: sitting Prep: DuraPrep and site prepped and draped Patient monitoring: continuous pulse ox and blood pressure Approach: midline Location: L4-L5 Injection technique: LOR air  Needle:  Needle type: Tuohy  Needle gauge: 17 G Needle length: 9 cm and 9 Needle insertion depth: 8 cm Catheter type: closed end flexible Catheter size: 19 Gauge Catheter at skin depth: 13 cm Test dose: negative and Other  Assessment Events: blood not aspirated, no cerebrospinal fluid, injection not painful, no injection resistance, no paresthesia and negative IV test  Additional Notes Patient identified. Risks and benefits discussed including failed block, incomplete  Pain control, post dural puncture headache, nerve damage, paralysis, blood pressure Changes, nausea, vomiting, reactions to medications-both toxic and allergic and post Partum back pain. All questions were answered. Patient expressed understanding and wished to proceed. Sterile technique was used throughout procedure. Epidural site was Dressed with sterile barrier dressing. No paresthesias, signs of intravascular injection Or signs of intrathecal spread were encountered.  Patient was more comfortable after the epidural was dosed. Please see RN's note for documentation of vital signs and FHR which are stable. Reason for block:procedure for pain

## 2023-01-06 NOTE — Progress Notes (Signed)
Feeling more ctx.  Cervical catheter came out around 0930, increasing pitocin since then Afeb, VSS FHT-130, Cat 1, ctx q 3 min VE-5/50/-2, vtx, AROM clear Continue pitocin, monitor progress, anticipate VBAC, continue PCN for GBS pos

## 2023-01-06 NOTE — H&P (Signed)
Kimberly Ponce is a 27 y.o.G61P0101 female presenting for elective IOL for TOLAC/ Pt has a history of : Previous cesarean section .at 36wks due to NRFHT Hiistory of preE - BP normal this pregnancy. Baby ASA Uterine leiomyoma - no issues in pregnancy Maternal obesity - prepreg BMI 39 Vanishing twin - noted at 11 weeks  She is GBS pos. NKDA.  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   PID (pelvic inflammatory disease)    age 4   Pregnancy induced hypertension    Severely increased blood pressure and edema during pregnancy 07/24/2020   Status post primary low transverse cesarean section 07/24/2020   Trichimoniasis 2019   Past Surgical History:  Procedure Laterality Date   CESAREAN SECTION  07/24/2020   Procedure: CESAREAN SECTION;  Surgeon: Steva Ready, DO;  Location: [facility];  Service: Obstetrics;;   COSMETIC SURGERY     brazilian butt lift   FINGER TENDON REPAIR     Family History: family history includes Cancer in her father and another family member; Diabetes in an other family member; Hypertension in her father and mother. Social History:  reports that she quit smoking about 3 years ago. Her smoking use included cigarettes. She has never used smokeless tobacco. She reports that she does not currently use alcohol. She reports that she does not use drugs.     Maternal Diabetes: No Genetic Screening: Normal Maternal Ultrasounds/Referrals: Normal Fetal Ultrasounds or other Referrals:  None Maternal Substance Abuse:  No Significant Maternal  Medications:  None Significant Maternal Lab Results:  Group B Strep positive Number of Prenatal Visits:greater than 3 verified prenatal visits Maternal Vaccinations:TDap Other Comments:  None  Review of Systems  Constitutional:  Negative for activity change and fatigue.  Cardiovascular:  Positive for leg swelling. Negative for chest pain and palpitations.  Gastrointestinal:  Positive for abdominal pain.  Genitourinary:  Positive for pelvic pain. Negative for vaginal bleeding.  Musculoskeletal:  Negative for back pain.  Neurological:  Negative for light-headedness, numbness and headaches.  Psychiatric/Behavioral:  The patient is not nervous/anxious.    Maternal Medical History:  Reason for admission: IOL for TOLAC  Contractions: Frequency: rare.   Perceived severity is mild.   Fetal activity: Perceived fetal activity is normal.   Prenatal complications: no prenatal complications Prenatal Complications - Diabetes: none.     Blood pressure 124/77, pulse 75, temperature 98.4 F (36.9 C), temperature source Oral, resp. rate 18, height 5\' 3"  (1.6 m), weight 108.9 kg, last menstrual period 04/01/2022, unknown if currently breastfeeding. Maternal Exam:  Uterine Assessment: Contraction frequency is rare.  Abdomen: Patient reports generalized tenderness.  Estimated fetal weight is AGA.   Fetal presentation: vertex Introitus: Normal vulva. Vulva is negative for lesion.  Normal vagina.  Vagina is  negative for condylomata.  Pelvis: adequate for delivery.   Cervix: Cervix evaluated by digital exam.     Fetal Exam Fetal Monitor Review: Baseline rate: 130.  Variability: moderate (6-25 bpm).   Pattern: accelerations present and no decelerations.   Fetal State Assessment: Category I - tracings are normal.   Physical Exam Vitals and nursing note reviewed. Exam conducted with a chaperone present.  Constitutional:      Appearance: Normal appearance. She is obese.  Cardiovascular:      Rate and Rhythm: Normal rate.     Pulses: Normal pulses.  Pulmonary:     Effort: Pulmonary effort is normal.  Abdominal:     Tenderness: There is generalized abdominal tenderness.  Genitourinary:    General: Normal vulva.  Vulva is no lesion.  Musculoskeletal:        General: Normal range of motion.     Cervical back: Normal range of motion.  Skin:    General: Skin is warm.     Capillary Refill: Capillary refill takes 2 to 3 seconds.  Neurological:     General: No focal deficit present.     Mental Status: She is alert and oriented to person, place, and time. Mental status is at baseline.  Psychiatric:        Mood and Affect: Mood normal.        Behavior: Behavior normal.        Thought Content: Thought content normal.        Judgment: Judgment normal.     Prenatal labs: ABO, Rh: --/--/PENDING (10/09 0155) Antibody: PENDING (10/09 0155) Rubella:   RPR:    HBsAg:    HIV:    GBS:     Assessment/Plan: 27yo G2P0101 at 39 1/7wks for IOL for TOLAC - Admit  - Foley with low dose pitocin  - Pain control prn  - GBS pos - start PCN - Anticipate svd    [name] 01/06/2023, 2:39 AM

## 2023-01-06 NOTE — Anesthesia Preprocedure Evaluation (Signed)
Anesthesia Evaluation  Patient identified by MRN, date of birth, ID band Patient awake    Reviewed: Allergy & Precautions, Patient's Chart, lab work & pertinent test results  Airway Mallampati: II       Dental no notable dental hx.    Pulmonary former smoker   Pulmonary exam normal        Cardiovascular hypertension, Normal cardiovascular exam Rhythm:Regular     Neuro/Psych negative neurological ROS  negative psych ROS   GI/Hepatic Neg liver ROS,GERD  ,,  Endo/Other    Morbid obesity  Renal/GU negative Renal ROS  negative genitourinary   Musculoskeletal negative musculoskeletal ROS (+)    Abdominal  (+) + obese  Peds  Hematology  (+) Blood dyscrasia, anemia   Anesthesia Other Findings   Reproductive/Obstetrics (+) Pregnancy Hx/o Previous C/Section                              Anesthesia Physical Anesthesia Plan  ASA: 3  Anesthesia Plan: Epidural   Post-op Pain Management:    Induction: Intravenous  PONV Risk Score and Plan:   Airway Management Planned: Natural Airway  Additional Equipment:   Intra-op Plan:   Post-operative Plan:   Informed Consent: I have reviewed the patients History and Physical, chart, labs and discussed the procedure including the risks, benefits and alternatives for the proposed anesthesia with the patient or authorized representative who has indicated his/her understanding and acceptance.       Plan Discussed with: Anesthesiologist  Anesthesia Plan Comments:          Anesthesia Quick Evaluation

## 2023-01-07 LAB — CBC
HCT: 29 % — ABNORMAL LOW (ref 36.0–46.0)
Hemoglobin: 9.7 g/dL — ABNORMAL LOW (ref 12.0–15.0)
MCH: 30.7 pg (ref 26.0–34.0)
MCHC: 33.4 g/dL (ref 30.0–36.0)
MCV: 91.8 fL (ref 80.0–100.0)
Platelets: 224 10*3/uL (ref 150–400)
RBC: 3.16 MIL/uL — ABNORMAL LOW (ref 3.87–5.11)
RDW: 13.3 % (ref 11.5–15.5)
WBC: 9.3 10*3/uL (ref 4.0–10.5)
nRBC: 0 % (ref 0.0–0.2)

## 2023-01-07 MED ORDER — DIBUCAINE (PERIANAL) 1 % EX OINT: 1.0000 | TOPICAL_OINTMENT | CUTANEOUS | Status: DC | PRN

## 2023-01-07 MED ORDER — COCONUT OIL OIL
1.0000 | TOPICAL_OIL | Status: DC | PRN
  Administered 2023-01-07: 1 via TOPICAL

## 2023-01-07 MED ORDER — METHYLERGONOVINE MALEATE 0.2 MG/ML IJ SOLN: 0.2000 mg | INTRAMUSCULAR | Status: DC | PRN

## 2023-01-07 MED ORDER — BENZOCAINE-MENTHOL 20-0.5 % EX AERO
1.0000 | INHALATION_SPRAY | CUTANEOUS | Status: DC | PRN
  Administered 2023-01-07: 1 via TOPICAL
  Filled 2023-01-07: qty 56

## 2023-01-07 MED ORDER — IBUPROFEN 600 MG PO TABS
600.0000 mg | ORAL_TABLET | Freq: Four times a day (QID) | ORAL | Status: DC
  Administered 2023-01-07 – 2023-01-08 (×7): 600 mg via ORAL
  Filled 2023-01-07 (×7): qty 1

## 2023-01-07 MED ORDER — METHYLERGONOVINE MALEATE 0.2 MG PO TABS: 0.2000 mg | ORAL_TABLET | ORAL | Status: DC | PRN

## 2023-01-07 MED ORDER — ONDANSETRON HCL 4 MG/2ML IJ SOLN: 4.0000 mg | INTRAMUSCULAR | Status: DC | PRN

## 2023-01-07 MED ORDER — MEASLES, MUMPS & RUBELLA VAC IJ SOLR: 0.5000 mL | Freq: Once | INTRAMUSCULAR | Status: DC

## 2023-01-07 MED ORDER — OXYCODONE HCL 5 MG PO TABS: 5.0000 mg | ORAL_TABLET | ORAL | Status: DC | PRN

## 2023-01-07 MED ORDER — TETANUS-DIPHTH-ACELL PERTUSSIS 5-2.5-18.5 LF-MCG/0.5 IM SUSY: 0.5000 mL | PREFILLED_SYRINGE | Freq: Once | INTRAMUSCULAR | Status: DC

## 2023-01-07 MED ORDER — SIMETHICONE 80 MG PO CHEW: 80.0000 mg | CHEWABLE_TABLET | ORAL | Status: DC | PRN

## 2023-01-07 MED ORDER — WITCH HAZEL-GLYCERIN EX PADS
1.0000 | MEDICATED_PAD | CUTANEOUS | Status: DC | PRN
  Administered 2023-01-07: 1 via TOPICAL

## 2023-01-07 MED ORDER — OXYCODONE HCL 5 MG PO TABS: 10.0000 mg | ORAL_TABLET | ORAL | Status: DC | PRN

## 2023-01-07 MED ORDER — ONDANSETRON HCL 4 MG PO TABS: 4.0000 mg | ORAL_TABLET | ORAL | Status: DC | PRN

## 2023-01-07 MED ORDER — MAGNESIUM HYDROXIDE 400 MG/5ML PO SUSP: 30.0000 mL | ORAL | Status: DC | PRN

## 2023-01-07 MED ORDER — ACETAMINOPHEN 325 MG PO TABS
650.0000 mg | ORAL_TABLET | ORAL | Status: DC | PRN
  Administered 2023-01-07 – 2023-01-08 (×6): 650 mg via ORAL
  Filled 2023-01-07 (×6): qty 2

## 2023-01-07 MED ORDER — DIPHENHYDRAMINE HCL 25 MG PO CAPS: 25.0000 mg | ORAL_CAPSULE | Freq: Four times a day (QID) | ORAL | Status: DC | PRN

## 2023-01-07 MED ORDER — SENNOSIDES-DOCUSATE SODIUM 8.6-50 MG PO TABS
2.0000 | ORAL_TABLET | Freq: Every day | ORAL | Status: DC
  Administered 2023-01-07 – 2023-01-08 (×2): 2 via ORAL
  Filled 2023-01-07 (×2): qty 2

## 2023-01-07 MED ORDER — PRENATAL MULTIVITAMIN CH
1.0000 | ORAL_TABLET | Freq: Every day | ORAL | Status: DC
  Administered 2023-01-07 – 2023-01-08 (×2): 1 via ORAL
  Filled 2023-01-07 (×2): qty 1

## 2023-01-07 MED ORDER — ZOLPIDEM TARTRATE 5 MG PO TABS: 5.0000 mg | ORAL_TABLET | Freq: Every evening | ORAL | Status: DC | PRN

## 2023-01-07 NOTE — Anesthesia Postprocedure Evaluation (Signed)
Anesthesia Post Note  Patient: Kimberly Ponce  Procedure(s) Performed: AN AD HOC LABOR EPIDURAL     Patient location during evaluation: Mother Baby Anesthesia Type: Epidural Level of consciousness: awake, oriented and awake and alert Pain management: pain level controlled Vital Signs Assessment: post-procedure vital signs reviewed and stable Respiratory status: spontaneous breathing, respiratory function stable and nonlabored ventilation Cardiovascular status: stable Postop Assessment: no headache, adequate PO intake, able to ambulate, patient able to bend at knees and no apparent nausea or vomiting Anesthetic complications: no   No notable events documented.  Last Vitals:  Vitals:   01/07/23 0144 01/07/23 0430  BP: 117/60 135/83  Pulse: 70 72  Resp: 20 20  Temp: 36.8 C 36.7 C  SpO2: 100% 100%    Last Pain:  Vitals:   01/07/23 0830  TempSrc:   PainSc: 4    Pain Goal:                   [name]

## 2023-01-07 NOTE — Progress Notes (Signed)
Post Partum Day 1 Subjective: no complaints, up ad lib, voiding, and tolerating PO  Objective: Blood pressure 135/83, pulse 72, temperature 98 F (36.7 C), temperature source Oral, resp. rate 20, height 5\' 3"  (1.6 m), weight 108.9 kg, last menstrual period 04/01/2022, SpO2 100%, unknown if currently breastfeeding.  Physical Exam:  General: alert, cooperative, and appears stated age Lochia: appropriate Uterine Fundus: firm DVT Evaluation: No evidence of DVT seen on physical exam.  Recent Labs    01/06/23 0155 01/07/23 0622  HGB 9.7* 9.7*  HCT 29.8* 29.0*    Assessment/Plan: Plan for discharge tomorrow Breastfeeding   LOS: 1 day   Waynard Reeds, MD 01/07/2023, 11:02 AM

## 2023-01-07 NOTE — Lactation Note (Signed)
This note was copied from a baby's chart. Lactation Consultation Note  Patient Name: Kimberly Ponce [id number] Date: 01/07/2023 Age:29 hours  Attempted to see mom but she and everyone in room were sleeping.    Maternal Data    Feeding    LATCH Score                    Lactation Tools Discussed/Used    Interventions    Discharge    Consult Status      Charyl Dancer 01/07/2023, 3:54 AM

## 2023-01-08 MED ORDER — IBUPROFEN 600 MG PO TABS: 600.0000 mg | ORAL_TABLET | Freq: Four times a day (QID) | ORAL | 0 refills | Status: DC

## 2023-01-08 NOTE — Progress Notes (Signed)
Post Partum Day 2 Subjective: no complaints, up ad lib, voiding, tolerating PO, and + flatus. Lochia normal. Breastfeeding well  Objective: Blood pressure 125/86, pulse 67, temperature 97.7 F (36.5 C), temperature source Oral, resp. rate 18, height 5\' 3"  (1.6 m), weight 108.9 kg, last menstrual period 04/01/2022, SpO2 100%, unknown if currently breastfeeding.  Physical Exam:  General: alert and cooperative Lochia: appropriate Uterine Fundus: firm Incision: N/A DVT Evaluation: No evidence of DVT seen on physical exam. No cords or calf tenderness.  Recent Labs    01/06/23 0155 01/07/23 0622  HGB 9.7* 9.7*  HCT 29.8* 29.0*    Assessment/Plan: Discharge home   LOS: 2 days   Junius Creamer, DO 01/08/2023, 1:44 PM

## 2023-01-08 NOTE — Discharge Instructions (Signed)
Call office with any concerns [phone] 

## 2023-01-08 NOTE — Lactation Note (Signed)
This note was copied from a baby's chart. Lactation Consultation Note  Patient Name: Girl Alayiah Fontes [id number] Date: 01/08/2023 Age:29 hours Reason for consult: Initial assessment;Early term 58-38.6wks Mom stated BF going good having no difficulty or painful latches. Experienced BF mom states this baby wants to feed a lot more than her first child. Newborn feeding habits, behavior, STS, I&O, body alignment, support reviewed. Mom encouraged to feed baby 8-12 times/24 hours and with feeding cues.  Mom feels good about how BF is going and doesn't have any questions or needs at this time.  Encouraged to call if needed.  Maternal Data Does the patient have breastfeeding experience prior to this delivery?: Yes How long did the patient breastfeed?: 7 months to her now 27 yr old  Feeding    LATCH Score       Type of Nipple: Everted at rest and after stimulation  Comfort (Breast/Nipple): Soft / non-tender         Lactation Tools Discussed/Used    Interventions Interventions: Breast feeding basics reviewed;Support pillows;Position options;Education;LC Services brochure  Discharge    Consult Status Consult Status: Complete    Lataysha Vohra G 01/08/2023, 12:34 AM

## 2023-01-08 NOTE — Discharge Summary (Signed)
Postpartum Discharge Summary  Date of Service updated     Patient Name: Kimberly Ponce DOB: 03-Apr-1995 MRN: [id number]  Date of admission: 01/06/2023 Delivery date:01/06/2023 Delivering provider: Jackelyn Knife, TODD Date of discharge: 01/08/2023  Admitting diagnosis: Term pregnancy [Z34.90] Intrauterine pregnancy: [redacted]w[redacted]d     Secondary diagnosis:  Principal Problem:   Term pregnancy  Additional problems: GBS positive    Discharge diagnosis: VBAC                                              Post partum procedures: N/A Augmentation: Pitocin, foley Complications: None  Hospital course: Induction of Labor With Vaginal Delivery   27 y.o. yo [id number] at [redacted]w[redacted]d was admitted to the hospital 01/06/2023 for induction of labor.  Indication for induction:  TOLAC .  Patient had an labor course complicated by N/A Membrane Rupture Time/Date: 2:46 PM,01/06/2023  Delivery Method:VBAC, Spontaneous Operative Delivery:N/A Episiotomy: None Lacerations:  None Details of delivery can be found in separate delivery note.  Patient had a postpartum course complicated by N/A. Patient is discharged home 01/08/23.  Newborn Data: Birth date:01/06/2023 Birth time:10:07 PM Gender:Female Living status:Living Apgars:9 ,9  Weight:3100 g  Magnesium Sulfate received: No BMZ received: No Rhophylac:No MMR:No T-DaP:Given prenatally Flu: No RSV Vaccine received: No Transfusion:No Immunizations administered: There is no immunization history for the selected administration types on file for this patient.  Physical exam  Vitals:   01/07/23 1325 01/07/23 1456 01/07/23 2045 01/08/23 0600  BP: (!) 121/91 121/78 113/81 125/86  Pulse: 81  82 67  Resp: 18  18 18   Temp: 98.1 F (36.7 C)  98.2 F (36.8 C) 97.7 F (36.5 C)  TempSrc: Oral  Oral Oral  SpO2: 100%  100% 100%  Weight:      Height:       General: alert and no distress Lochia: appropriate Uterine Fundus: firm Incision: N/A DVT Evaluation: No  evidence of DVT seen on physical exam. No cords or calf tenderness. Labs: Lab Results  Component Value Date   WBC 9.3 01/07/2023   HGB 9.7 (L) 01/07/2023   HCT 29.0 (L) 01/07/2023   MCV 91.8 01/07/2023   PLT 224 01/07/2023      Latest Ref Rng & Units 05/14/2021   12:06 AM  CMP  Glucose 70 - 99 mg/dL 147   BUN 6 - 20 mg/dL 17   Creatinine 8.29 - 1.00 mg/dL 5.62   Sodium 130 - 865 mmol/L 135   Potassium 3.5 - 5.1 mmol/L 3.2   Chloride 98 - 111 mmol/L 106   CO2 22 - 32 mmol/L 21   Calcium 8.9 - 10.3 mg/dL 9.0   Total Protein 6.5 - 8.1 g/dL 7.3   Total Bilirubin 0.3 - 1.2 mg/dL 0.3   Alkaline Phos 38 - 126 U/L 41   AST 15 - 41 U/L 15   ALT 0 - 44 U/L 13    Edinburgh Score:    01/07/2023    3:21 PM  Edinburgh Postnatal Depression Scale Screening Tool  I have been able to laugh and see the funny side of things. 0  I have looked forward with enjoyment to things. 0  I have blamed myself unnecessarily when things went wrong. 1  I have been anxious or worried for no good reason. 0  I have felt scared or panicky for no good reason.  0  Things have been getting on top of me. 0  I have been so unhappy that I have had difficulty sleeping. 0  I have felt sad or miserable. 0  I have been so unhappy that I have been crying. 0  The thought of harming myself has occurred to me. 0  Edinburgh Postnatal Depression Scale Total 1      After visit meds:  Allergies as of 01/08/2023       Reactions   Other Other (See Comments)   Dark chocolate: throat swells        Medication List     STOP taking these medications    acetaminophen 500 MG tablet Commonly known as: TYLENOL   aspirin EC 81 MG tablet   cholecalciferol 25 MCG (1000 UNIT) tablet Commonly known as: VITAMIN D3   PrePLUS 27-1 MG Tabs       TAKE these medications    ibuprofen 600 MG tablet Commonly known as: ADVIL Take 1 tablet (600 mg total) by mouth every 6 (six) hours.         Discharge home in  stable condition Infant Feeding: Breast Infant Disposition:home with mother Discharge instruction: per After Visit Summary and Postpartum booklet. Activity: Advance as tolerated. Pelvic rest for 6 weeks.  Diet: routine diet Anticipated Birth Control: Unsure Postpartum Appointment:6 weeks Additional Postpartum F/U: Postpartum Depression checkup Future Appointments:No future appointments. Follow up Visit:  Follow-up Information     [facility] Follow up in 6 week(s).   Contact information: [address]  [address] [place] [address] [phone]                     01/08/2023 Junius Creamer, DO

## 2023-01-30 ENCOUNTER — Telehealth (HOSPITAL_COMMUNITY): Payer: Self-pay | Admitting: *Deleted

## 2023-01-30 NOTE — Telephone Encounter (Signed)
01/30/2023  Name: Kimberly Ponce MRN: [id number] DOB: 14-Nov-1995  Reason for Call:  Transition of Care Hospital Discharge Call  Contact Status: Patient Contact Status: Complete  Language assistant needed:          Follow-Up Questions: Do You Have Any Concerns About Your Health As You Heal From Delivery?: No Do You Have Any Concerns About Your Infants Health?: No  Edinburgh Postnatal Depression Scale:  In the Past 7 Days:   EPDS not done at this time. Patient completed EPDS with pediatrician. No areas of concern identified, per patient report. PHQ2-9 Depression Scale:     Discharge Follow-up: Edinburgh score requires follow up?: N/A Patient was advised of the following resources:: Breastfeeding Support Group, Support Group  Post-discharge interventions: Reviewed Newborn Safe Sleep Practices  Signature Deforest Hoyles, RN, 01/30/23, [phone]

## 2023-08-11 ENCOUNTER — Inpatient Hospital Stay (HOSPITAL_COMMUNITY)

## 2023-08-11 ENCOUNTER — Other Ambulatory Visit: Payer: Self-pay

## 2023-08-11 ENCOUNTER — Encounter (HOSPITAL_COMMUNITY): Payer: Self-pay | Admitting: Obstetrics & Gynecology

## 2023-08-11 ENCOUNTER — Inpatient Hospital Stay (HOSPITAL_COMMUNITY)
Admission: AD | Admit: 2023-08-11 | Discharge: 2023-08-11 | Disposition: A | Discharge status: Home / Self Care | Attending: Obstetrics & Gynecology | Admitting: Obstetrics & Gynecology

## 2023-08-11 DIAGNOSIS — Z3A10 10 weeks gestation of pregnancy: Secondary | ICD-10-CM

## 2023-08-11 DIAGNOSIS — O4691 Antepartum hemorrhage, unspecified, first trimester: Secondary | ICD-10-CM | POA: Diagnosis present

## 2023-08-11 DIAGNOSIS — O209 Hemorrhage in early pregnancy, unspecified: Secondary | ICD-10-CM

## 2023-08-11 LAB — URINALYSIS, ROUTINE W REFLEX MICROSCOPIC
Bilirubin Urine: NEGATIVE
Glucose, UA: NEGATIVE mg/dL
Hgb urine dipstick: NEGATIVE
Ketones, ur: NEGATIVE mg/dL
Leukocytes,Ua: NEGATIVE
Nitrite: NEGATIVE
Protein, ur: NEGATIVE mg/dL
Specific Gravity, Urine: 1.025 (ref 1.005–1.030)
pH: 5 (ref 5.0–8.0)

## 2023-08-11 LAB — WET PREP, GENITAL
Clue Cells Wet Prep HPF POC: NONE SEEN
Sperm: NONE SEEN
Trich, Wet Prep: NONE SEEN
WBC, Wet Prep HPF POC: <10 (ref <10)
Yeast Wet Prep HPF POC: NONE SEEN

## 2023-08-11 LAB — CBC
HCT: 37 % (ref 36.0–46.0)
Hemoglobin: 12.4 g/dL (ref 12.0–15.0)
MCH: 30 pg (ref 26.0–34.0)
MCHC: 33.5 g/dL (ref 30.0–36.0)
MCV: 89.6 fL (ref 80.0–100.0)
Platelets: 324 10*3/uL (ref 150–400)
RBC: 4.13 MIL/uL (ref 3.87–5.11)
RDW: 13 % (ref 11.5–15.5)
WBC: 3.9 10*3/uL — ABNORMAL LOW (ref 4.0–10.5)
nRBC: 0 % (ref 0.0–0.2)

## 2023-08-11 LAB — POCT PREGNANCY, URINE: Preg Test, Ur: POSITIVE — AB

## 2023-08-11 LAB — HCG, QUANTITATIVE, PREGNANCY: hCG, Beta Chain, Quant, S: 1468 m[IU]/mL — ABNORMAL HIGH (ref <5)

## 2023-08-11 NOTE — MAU Provider Note (Signed)
 Chief Complaint:  Vaginal Bleeding and Abdominal Pain   HPI   None     Kimberly Ponce is a 28 y.o. [id number] at Unknown presenting to maternity admissions reporting vaginal bleeding. She endorses occasional spotting for the past 4 days. They have not been passing blood clots, have not bled through their clothes, and have not passed any tissues. Endorses associated mild cramping for the past 4 days, gradually decreasing. LMP unknown, either 05/29/2023 or 07/05/2023. They have not had IUP confirmed with ultrasound.  Pregnancy Course: Receives care at [facility]. Records reviewed. Patient is postpartum and breastfeeding, delivered 01/06/2023.  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   PID (pelvic inflammatory disease)    age 70   Pregnancy induced hypertension    Severely increased blood pressure and edema during pregnancy 07/24/2020   Status post primary low transverse cesarean section 07/24/2020   Trichimoniasis 2019   OB History  Gravida Para Term Preterm AB Living  3 2 1 1  2   SAB IAB Ectopic Multiple Live Births     0 2    # Outcome Date GA Lbr Len/2nd Weight Sex Type Anes PTL Lv  3 Current           2 Term 01/06/23 [redacted]w[redacted]d 18:12 / 00:24 3100 g F VBAC EPI  LIV  1 Preterm 07/24/20 [redacted]w[redacted]d  2230 g M CS-LTranv EPI  LIV     Complications: Preeclampsia, severe, third trimester   Past Surgical History:  Procedure Laterality Date   CESAREAN SECTION  07/24/2020   Procedure: CESAREAN SECTION;  Surgeon: Meldon Sport, DO;  Location: [facility];  Service: Obstetrics;;   COSMETIC SURGERY     brazilian butt lift   FINGER TENDON REPAIR     Family History  Problem Relation Age of Onset   Hypertension Mother    Cancer Father    Hypertension Father    Cancer Other    Diabetes Other    Social History    Tobacco Use   Smoking status: Former    Current packs/day: 0.00    Types: Cigarettes    Quit date: 11/29/2019    Years since quitting: 3.7   Smokeless tobacco: Never  Vaping Use   Vaping status: Former  Substance Use Topics   Alcohol use: Not Currently    Comment: occ   Drug use: No   Allergies  Allergen Reactions   Other Other (See Comments)    Dark chocolate: throat swells   Medications Prior to Admission  Medication Sig Dispense Refill Last Dose/Taking   Cholecalciferol 50 MCG (2000 UT) CAPS Take 2 capsules every day by oral route for 30 days.   Taking   ibuprofen  (ADVIL ) 600 MG tablet Take 1 tablet (600 mg total) by mouth every 6 (six) hours. 30 tablet 0     I have reviewed patient's Past Medical Hx, Surgical Hx, Family Hx, Social Hx, medications and allergies.   ROS  Pertinent items noted in HPI and remainder of comprehensive ROS otherwise negative.   PHYSICAL EXAM  Patient Vitals for the past 24 hrs:  BP Temp Temp src Pulse Resp SpO2  08/11/23 0848 124/87 98.3 F (36.8 C) Oral 72 18 99 %    Constitutional: Well-developed, well-nourished female in no acute distress.  HEENT: atraumatic, normocephalic. Neck has normal ROM. EOM intact. Cardiovascular: normal rate & rhythm, warm and well-perfused Respiratory: normal effort, no problems with respiration noted GI: Abd soft, non-tender, non-distended MSK: Extremities nontender, no edema, normal ROM Skin: warm and dry. Acyanotic, no jaundice or pallor. Neurologic: Alert and oriented x 4. No abnormal coordination. Psychiatric: Normal mood. Speech not slurred, not rapid/pressured. Patient is cooperative. GU: no CVA tenderness Pelvic exam: normal external genitalia, vulva, vagina, cervix, uterus and adnexa, VULVA: normal appearing vulva with no masses, tenderness or lesions, VAGINA: normal appearing vagina with normal color and discharge, no lesions, CERVIX: normal appearing cervix without discharge or lesions, multiparous  os, exam chaperoned by Ronda Cocks RN.   Dilation: Closed Exam by:: Caridad Chard PA Labs: Results for orders placed or performed during the hospital encounter of 08/11/23 (from the past 24 hours)  Pregnancy, urine POC     Status: Abnormal   Collection Time: 08/11/23  8:43 AM  Result Value Ref Range   Preg Test, Ur POSITIVE (A) NEGATIVE  Urinalysis, Routine w reflex microscopic -Urine, Clean Catch     Status: Abnormal   Collection Time: 08/11/23  8:52 AM  Result Value Ref Range   Color, Urine YELLOW YELLOW   APPearance HAZY (A) CLEAR   Specific Gravity, Urine 1.025 1.005 - 1.030   pH 5.0 5.0 - 8.0   Glucose, UA NEGATIVE NEGATIVE mg/dL   Hgb urine dipstick NEGATIVE NEGATIVE   Bilirubin Urine NEGATIVE NEGATIVE   Ketones, ur NEGATIVE NEGATIVE mg/dL   Protein, ur NEGATIVE NEGATIVE mg/dL   Nitrite NEGATIVE NEGATIVE   Leukocytes,Ua NEGATIVE NEGATIVE  Wet prep, genital     Status: None   Collection Time: 08/11/23  8:52 AM   Specimen: PATH Cytology Cervicovaginal Ancillary Only  Result Value Ref Range   Yeast Wet Prep HPF POC NONE SEEN NONE SEEN   Trich, Wet Prep NONE SEEN NONE SEEN   Clue Cells Wet Prep HPF POC NONE SEEN NONE SEEN   WBC, Wet Prep HPF POC <10 <10   Sperm NONE SEEN   CBC     Status: Abnormal   Collection Time: 08/11/23  9:18 AM  Result Value Ref Range   WBC 3.9 (L) 4.0 - 10.5 K/uL   RBC 4.13 3.87 - 5.11 MIL/uL   Hemoglobin 12.4 12.0 - 15.0 g/dL   HCT 40.9 81.1 - 91.4 %   MCV 89.6 80.0 - 100.0 fL   MCH 30.0 26.0 - 34.0 pg   MCHC 33.5 30.0 - 36.0 g/dL   RDW 78.2 95.6 - 21.3 %   Platelets 324 150 - 400 K/uL   nRBC 0.0 0.0 - 0.2 %  hCG, quantitative, pregnancy     Status: Abnormal   Collection Time: 08/11/23  9:18 AM  Result Value Ref Range   hCG, Beta Chain, Quant, S 1,468 (H) <5 mIU/mL    Imaging:  US  OB LESS THAN 14 WEEKS WITH OB TRANSVAGINAL Result Date: 08/11/2023 CLINICAL DATA:  [id number] Vaginal bleeding in pregnancy, first trimester [id number] Abdominal  pain during pregnancy in first trimester [id number]. EXAM: OBSTETRIC <14 WK US  AND TRANSVAGINAL OB US  TECHNIQUE: Both transabdominal and transvaginal ultrasound examinations were performed for complete evaluation of the gestation as well as the maternal uterus, adnexal regions, and pelvic cul-de-sac. Transvaginal technique was performed to assess early pregnancy. COMPARISON:  None Available. FINDINGS: Intrauterine gestational sac: There are several separate, anechoic structures within the fundal endometrium. The largest structure presumed to represent gestational sac measures up  to 3.7 x 7.4 mm. No discrete yolk sac seen. There are surrounding irregularly marginated anechoic areas, which may represent subchorionic hemorrhage. There are 2 additional separate oval-shaped anechoic areas along the inferior portion of the fundal endometrium measuring up to 5-6 mm in long axis. These are of indeterminate etiology. Follow-up examination is recommended in 10-14 days or earlier, as clinically indicated to document satisfactory progression of pregnancy. Embryo:  Not Visualized. MSD: Approximately 4.1 mm   5 w   1 d Subchorionic hemorrhage:  Please see above. Maternal uterus/adnexae: Bilateral ovaries are within normal limits. IMPRESSION: *There are several separate, anechoic structures within the fundal endometrium. The largest structure presumed to represent gestational sac measures up to 3.7 x 7.4 mm. No discrete yolk sac seen. There are surrounding irregularly marginated anechoic areas, which may represent subchorionic hemorrhage. There are 2 additional separate oval-shaped anechoic areas along the inferior portion of the fundal endometrium measuring up to 5-6 mm in long axis. These are of indeterminate etiology. Follow-up examination is recommended in 10-14 days or earlier, as clinically indicated to document satisfactory progression of pregnancy. Electronically Signed   By: Beula Brunswick M.D.   On: 08/11/2023 11:32   MDM  & MAU COURSE  MDM: Moderate  MAU Course: -Vital signs within normal limits.  -Ectopic rule out: CBC, US , beta-hCG. Blood type already on file. -Wet prep, UA, and GC/chlamydia to rule out infectious causes. Wet prep, UA negative. -CBC within normal limits. US  with gestational sac but no discrete yolk sac or fetal pole.   Differential diagnosis considered for 1st trimester vaginal bleeding includes but is not limited to: ectopic pregnancy, complete spontaneous abortion, incomplete abortion, missed abortion, threatened abortion, embryonic/fetal demise, cervical insufficiency, cervical or vaginal disorder   Orders Placed This Encounter  Procedures   Wet prep, genital   US  OB LESS THAN 14 WEEKS WITH OB TRANSVAGINAL   Urinalysis, Routine w reflex microscopic -Urine, Clean Catch   CBC   hCG, quantitative, pregnancy   Pregnancy, urine POC   Discharge patient   No orders of the defined types were placed in this encounter.   ASSESSMENT   1. Vaginal bleeding in pregnancy, first trimester   2. [redacted] weeks gestation of pregnancy     PLAN  Discharge home in stable condition with return precautions.  Follow up with outpatient viability US  in 7-10 days.    Follow-up Information     [facility] Follow up in 1 week(s).   Specialty: Obstetrics and Gynecology Why: Ultrasound Contact information: [address] [place]  [address] [phone]                 Allergies as of 08/11/2023       Reactions   Other Other (See Comments)   Dark chocolate: throat swells        Medication List     STOP taking these medications    ibuprofen  600 MG tablet Commonly known as: ADVIL        TAKE these medications    Cholecalciferol 50 MCG (2000 UT) Caps Take 2 capsules every day by oral route for 30 days.        Noreene Bearded, PA

## 2023-08-11 NOTE — Discharge Instructions (Signed)
 We will repeat your ultrasound in 1 week. You will get a phone call from the office to schedule!

## 2023-08-11 NOTE — MAU Note (Signed)
 Kimberly Ponce is a 28 y.o. at Unknown here in MAU reporting: she began having mild cramping and spotting on Sunday.  Reports VB is red and cramping has since decreased  LMP: Unsure either 3/1 or 4/7 Onset of complaint: Sunday Pain score: 0 Vitals:   08/11/23 0848  BP: 124/87  Pulse: 72  Resp: 18  Temp: 98.3 F (36.8 C)  SpO2: 99%     FHT: NA  Lab orders placed from triage: UPT & UA

## 2023-08-12 LAB — GC/CHLAMYDIA PROBE AMP (~~LOC~~) NOT AT ARMC
Chlamydia: NEGATIVE
Comment: NEGATIVE
Comment: NORMAL
Neisseria Gonorrhea: NEGATIVE

## 2023-08-18 ENCOUNTER — Other Ambulatory Visit: Payer: Self-pay | Admitting: *Deleted

## 2023-08-18 DIAGNOSIS — O3680X0 Pregnancy with inconclusive fetal viability, not applicable or unspecified: Secondary | ICD-10-CM

## 2023-08-19 ENCOUNTER — Encounter: Payer: Self-pay | Admitting: Family Medicine

## 2023-08-19 ENCOUNTER — Ambulatory Visit: Admitting: Family Medicine

## 2023-08-19 ENCOUNTER — Ambulatory Visit

## 2023-08-19 DIAGNOSIS — Z3A01 Less than 8 weeks gestation of pregnancy: Secondary | ICD-10-CM

## 2023-08-19 DIAGNOSIS — O3680X0 Pregnancy with inconclusive fetal viability, not applicable or unspecified: Secondary | ICD-10-CM

## 2023-08-19 NOTE — Progress Notes (Signed)
 GYNECOLOGY OFFICE VISIT NOTE  History:    Kimberly Ponce is a 28 y.o. [phone] here today for US  results.  Patient seen in MAU on 08/11/2023, at that time reported some vaginal spotting, had US  showing IUGS without YS as well as several adjacent areas suspected to be [facility], and a likely L corpus luteum cyst  US  images from today personally reviewed There has been interval progression since last visit with YS now visible in IUGS, as well as persistence of suspected SCH areas and L corpus luteum cyst  Patient reports no bleeding or cramping   Health Maintenance Due  Topic Date Due   Hepatitis C Screening  Never done   DTaP/Tdap/Td (1 - Tdap) Never done   Cervical Cancer Screening (Pap smear)  02/17/2016   COVID-19 Vaccine (3 - 2024-25 season) 11/29/2022    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   PID (pelvic inflammatory disease)    age 35   Pregnancy induced hypertension    Severely increased blood pressure and edema during pregnancy 07/24/2020   Status post primary low transverse cesarean section 07/24/2020   Trichimoniasis 2019    Past Surgical History:  Procedure Laterality Date   CESAREAN SECTION  07/24/2020   Procedure: CESAREAN SECTION;  Surgeon: Meldon Sport, DO;  Location: [facility] LD [facility]S;  Service: Obstetrics;;   COSMETIC SURGERY     brazilian butt lift   FINGER TENDON REPAIR      The following portions of the patient's history were reviewed and updated as appropriate: allergies, current medications, past family history, past medical history, past social history, past surgical history and problem list.   Health Maintenance:   Last pap: No Cervical Cancer Screening results to display.   Last mammogram:  N/a    Review of Systems:  Pertinent items noted in HPI and  remainder of comprehensive ROS otherwise negative.  Physical Exam:  LMP 05/29/2023  CONSTITUTIONAL: Well-developed, well-nourished female in no acute distress.  HEENT:  Normocephalic, atraumatic. External right and left ear normal. No scleral icterus.  NECK: Normal range of motion, supple, no masses noted on observation SKIN: No rash noted. Not diaphoretic. No erythema. No pallor. MUSCULOSKELETAL: Normal range of motion. No edema noted. NEUROLOGIC: Alert and oriented to person, place, and time. Normal muscle tone coordination.  PSYCHIATRIC: Normal mood and affect. Normal behavior. Normal judgment and thought content. RESPIRATORY: Effort normal, no problems with respiration noted   Labs and Imaging No results found for this or any previous visit (from the past week). US  OB LESS THAN 14 WEEKS WITH OB TRANSVAGINAL Result Date: 08/11/2023 CLINICAL DATA:  [id number] Vaginal bleeding in pregnancy, first trimester [id number] Abdominal pain during pregnancy in first trimester [id number]. EXAM: OBSTETRIC <14 WK US  AND TRANSVAGINAL OB US  TECHNIQUE: Both transabdominal and transvaginal ultrasound examinations were performed for complete evaluation of the gestation as well as the maternal uterus, adnexal regions, and pelvic cul-de-sac. Transvaginal technique was performed to assess early pregnancy. COMPARISON:  None Available. FINDINGS: Intrauterine gestational sac: There are several separate, anechoic structures within the fundal endometrium. The largest structure presumed to represent gestational sac measures up to 3.7 x 7.4 mm. No discrete yolk sac seen. There are surrounding irregularly marginated anechoic areas, which may represent subchorionic hemorrhage. There are 2 additional separate  oval-shaped anechoic areas along the inferior portion of the fundal endometrium measuring up to 5-6 mm in long axis. These are of indeterminate etiology. Follow-up examination is recommended in 10-14 days or earlier, as  clinically indicated to document satisfactory progression of pregnancy. Embryo:  Not Visualized. MSD: Approximately 4.1 mm   5 w   1 d Subchorionic hemorrhage:  Please see above. Maternal uterus/adnexae: Bilateral ovaries are within normal limits. IMPRESSION: *There are several separate, anechoic structures within the fundal endometrium. The largest structure presumed to represent gestational sac measures up to 3.7 x 7.4 mm. No discrete yolk sac seen. There are surrounding irregularly marginated anechoic areas, which may represent subchorionic hemorrhage. There are 2 additional separate oval-shaped anechoic areas along the inferior portion of the fundal endometrium measuring up to 5-6 mm in long axis. These are of indeterminate etiology. Follow-up examination is recommended in 10-14 days or earlier, as clinically indicated to document satisfactory progression of pregnancy. Electronically Signed   By: Beula Brunswick M.D.   On: 08/11/2023 11:32      Assessment and Plan:   Problem List Items Addressed This Visit       Other   Pregnancy with uncertain fetal viability - Primary   US  demonstrates interval progression from last week along with ongoing nearby findings suspicious for [facility] vs possible early vanishing twin. Recommended follow up US  in 1 week to ensure progression to fetal pole with cardiac activity, patient in agreement with this plan. Reviewed MAU precautions.       Relevant Orders   US  OB LESS THAN 14 WEEKS WITH OB TRANSVAGINAL    Routine preventative health maintenance measures emphasized. Please refer to After Visit Summary for other counseling recommendations.   Return in about 1 week (around 08/26/2023) for repeat viability ultrasound.    Total face-to-face time with patient: 15 minutes.  Over 50% of encounter was spent on counseling and coordination of care.   Teena Feast, MD/MPH Attending Family Medicine Physician, [facility] for [facility], [facility]

## 2023-08-19 NOTE — Assessment & Plan Note (Signed)
 US  demonstrates interval progression from last week along with ongoing nearby findings suspicious for [facility] vs possible early vanishing twin. Recommended follow up US  in 1 week to ensure progression to fetal pole with cardiac activity, patient in agreement with this plan. Reviewed MAU precautions.

## 2023-08-30 ENCOUNTER — Other Ambulatory Visit

## 2023-08-31 ENCOUNTER — Other Ambulatory Visit

## 2023-09-07 ENCOUNTER — Other Ambulatory Visit
